# Patient Record
Sex: Female | Born: 1990 | Race: White | Hispanic: No | Marital: Married | Smoking: Never smoker
Health system: Southern US, Community
[De-identification: ages and names within clinical notes are randomized; demographics above are authoritative.]

## PROBLEM LIST (undated history)

## (undated) DIAGNOSIS — D649 Anemia, unspecified: Secondary | ICD-10-CM

## (undated) DIAGNOSIS — R519 Headache, unspecified: Secondary | ICD-10-CM

## (undated) DIAGNOSIS — R51 Headache: Secondary | ICD-10-CM

## (undated) HISTORY — PX: MOUTH SURGERY: SHX715

## (undated) HISTORY — PX: TOOTH EXTRACTION: SUR596

---

## 2015-10-31 NOTE — L&D Delivery Note (Addendum)
Delivery Note G1 at 7717w5d admit in early labor, SROM clear AF at 1445 on 06/19/16, GBS pos w/ PCN prophylaxis initiated on admit. Hydrotherapy x 1 hr then Nitrous Oxide pending epidural Pitocin augmentation for latency Maternal temp and tachy, fetal tachy, presumptive chorio, ABX per protocol  Complete dilation at 0255 Onset of pushing at 0300 FHR second stage category 2  Delivery of a viable baby girl with strong cry at 0344 by CNM in LOA position. Moderate meconium stained fluid following body, minimal amount of fluid noted, not malodorous. Nuchal Cord - none. Cord double clamped after cessation of pulsation, cut by FOB.  Cord blood sample collected.  Arterial cord blood sample - NA.  Placenta delivered schultz intact with 3 VC.  Placenta to pathology. Uterine tone firm, bleeding minimal after initial gush.  2nd degree perineal laceration identified.  Anesthesia: epidural Repair 3.0 vicryl in standard fashion Est. Blood Loss (mL): 300  APGAR: 8 / 9 Mom to postpartum.  Baby to Couplet care / Skin to Skin.  Continue Unasyn 3 gm IVPB q 6 hrs until afebrile x 24 hrs   Neta Mendsaniela C Emery Binz, CNM, MSN 06/20/2016, 4:22 AM

## 2015-11-17 LAB — OB RESULTS CONSOLE GC/CHLAMYDIA
Chlamydia: NEGATIVE
Gonorrhea: NEGATIVE

## 2015-11-17 LAB — OB RESULTS CONSOLE ANTIBODY SCREEN: Antibody Screen: NEGATIVE

## 2015-11-17 LAB — OB RESULTS CONSOLE RPR: RPR: NONREACTIVE

## 2015-11-17 LAB — OB RESULTS CONSOLE ABO/RH: RH TYPE: POSITIVE

## 2015-11-17 LAB — OB RESULTS CONSOLE HEPATITIS B SURFACE ANTIGEN: Hepatitis B Surface Ag: NEGATIVE

## 2015-11-17 LAB — OB RESULTS CONSOLE HIV ANTIBODY (ROUTINE TESTING): HIV: NONREACTIVE

## 2015-11-17 LAB — OB RESULTS CONSOLE RUBELLA ANTIBODY, IGM: Rubella: IMMUNE

## 2015-11-17 LAB — OB RESULTS CONSOLE GBS: STREP GROUP B AG: POSITIVE

## 2016-03-14 ENCOUNTER — Inpatient Hospital Stay (HOSPITAL_COMMUNITY): Admission: AD | Admit: 2016-03-14 | Payer: Self-pay | Source: Ambulatory Visit | Admitting: Obstetrics & Gynecology

## 2016-06-09 ENCOUNTER — Inpatient Hospital Stay (HOSPITAL_COMMUNITY)
Admission: AD | Admit: 2016-06-09 | Discharge: 2016-06-09 | Disposition: A | Discharge status: Home / Self Care | Payer: BLUE CROSS/BLUE SHIELD | Source: Ambulatory Visit | Attending: Obstetrics & Gynecology | Admitting: Obstetrics & Gynecology

## 2016-06-09 ENCOUNTER — Encounter (HOSPITAL_COMMUNITY): Payer: Self-pay | Admitting: *Deleted

## 2016-06-09 DIAGNOSIS — Z3A40 40 weeks gestation of pregnancy: Secondary | ICD-10-CM | POA: Diagnosis not present

## 2016-06-09 HISTORY — DX: Headache, unspecified: R51.9

## 2016-06-09 HISTORY — DX: Anemia, unspecified: D64.9

## 2016-06-09 HISTORY — DX: Headache: R51

## 2016-06-09 LAB — URINALYSIS, ROUTINE W REFLEX MICROSCOPIC
Bilirubin Urine: NEGATIVE
GLUCOSE, UA: NEGATIVE mg/dL
Hgb urine dipstick: NEGATIVE
Ketones, ur: NEGATIVE mg/dL
Nitrite: NEGATIVE
PH: 7 (ref 5.0–8.0)
Protein, ur: NEGATIVE mg/dL
SPECIFIC GRAVITY, URINE: 1.01 (ref 1.005–1.030)

## 2016-06-09 LAB — URINE MICROSCOPIC-ADD ON

## 2016-06-09 NOTE — MAU Note (Signed)
C/o ucs all day; ucs got stronger around 1500 this afternoon;

## 2016-06-09 NOTE — Discharge Instructions (Signed)

## 2016-06-19 ENCOUNTER — Inpatient Hospital Stay (HOSPITAL_COMMUNITY)
Admission: AD | Admit: 2016-06-19 | Discharge: 2016-06-22 | DRG: 775 | Disposition: A | Discharge status: Home / Self Care | Payer: BLUE CROSS/BLUE SHIELD | Source: Ambulatory Visit | Attending: Obstetrics | Admitting: Obstetrics

## 2016-06-19 ENCOUNTER — Inpatient Hospital Stay (HOSPITAL_COMMUNITY): Payer: BLUE CROSS/BLUE SHIELD | Admitting: Anesthesiology

## 2016-06-19 ENCOUNTER — Encounter (HOSPITAL_COMMUNITY): Payer: Self-pay | Admitting: *Deleted

## 2016-06-19 DIAGNOSIS — Z3A40 40 weeks gestation of pregnancy: Secondary | ICD-10-CM

## 2016-06-19 DIAGNOSIS — O411230 Chorioamnionitis, third trimester, not applicable or unspecified: Secondary | ICD-10-CM | POA: Diagnosis present

## 2016-06-19 DIAGNOSIS — O411290 Chorioamnionitis, unspecified trimester, not applicable or unspecified: Secondary | ICD-10-CM | POA: Clinically undetermined

## 2016-06-19 DIAGNOSIS — O99824 Streptococcus B carrier state complicating childbirth: Secondary | ICD-10-CM | POA: Diagnosis present

## 2016-06-19 DIAGNOSIS — O4202 Full-term premature rupture of membranes, onset of labor within 24 hours of rupture: Principal | ICD-10-CM | POA: Diagnosis present

## 2016-06-19 LAB — TYPE AND SCREEN
ABO/RH(D): O POS
Antibody Screen: NEGATIVE

## 2016-06-19 LAB — ABO/RH: ABO/RH(D): O POS

## 2016-06-19 LAB — CBC
HCT: 39.4 % (ref 36.0–46.0)
Hemoglobin: 14.2 g/dL (ref 12.0–15.0)
MCH: 31.2 pg (ref 26.0–34.0)
MCHC: 36 g/dL (ref 30.0–36.0)
MCV: 86.6 fL (ref 78.0–100.0)
PLATELETS: 140 10*3/uL — AB (ref 150–400)
RBC: 4.55 MIL/uL (ref 3.87–5.11)
RDW: 12.7 % (ref 11.5–15.5)
WBC: 14.3 10*3/uL — ABNORMAL HIGH (ref 4.0–10.5)

## 2016-06-19 MED ORDER — EPHEDRINE 5 MG/ML INJ
10.0000 mg | INTRAVENOUS | Status: DC | PRN
  Filled 2016-06-19: qty 4

## 2016-06-19 MED ORDER — LACTATED RINGERS IV SOLN: 500.0000 mL | Freq: Once | INTRAVENOUS | Status: DC

## 2016-06-19 MED ORDER — FENTANYL 2.5 MCG/ML BUPIVACAINE 1/10 % EPIDURAL INFUSION (WH - ANES)
INTRAMUSCULAR | Status: AC
  Filled 2016-06-19: qty 125

## 2016-06-19 MED ORDER — TERBUTALINE SULFATE 1 MG/ML IJ SOLN
0.2500 mg | Freq: Once | INTRAMUSCULAR | Status: DC | PRN
  Filled 2016-06-19: qty 1

## 2016-06-19 MED ORDER — ONDANSETRON HCL 4 MG/2ML IJ SOLN: 4.0000 mg | Freq: Four times a day (QID) | INTRAMUSCULAR | Status: DC | PRN

## 2016-06-19 MED ORDER — OXYCODONE-ACETAMINOPHEN 5-325 MG PO TABS: 1.0000 | ORAL_TABLET | ORAL | Status: DC | PRN

## 2016-06-19 MED ORDER — OXYCODONE-ACETAMINOPHEN 5-325 MG PO TABS: 2.0000 | ORAL_TABLET | ORAL | Status: DC | PRN

## 2016-06-19 MED ORDER — FLEET ENEMA 7-19 GM/118ML RE ENEM: 1.0000 | ENEMA | Freq: Every day | RECTAL | Status: DC | PRN

## 2016-06-19 MED ORDER — FENTANYL 2.5 MCG/ML BUPIVACAINE 1/10 % EPIDURAL INFUSION (WH - ANES)
14.0000 mL/h | INTRAMUSCULAR | Status: DC | PRN
  Administered 2016-06-19: 14 mL/h via EPIDURAL

## 2016-06-19 MED ORDER — OXYTOCIN 40 UNITS IN LACTATED RINGERS INFUSION - SIMPLE MED
2.5000 [IU]/h | INTRAVENOUS | Status: DC
  Filled 2016-06-19: qty 1000

## 2016-06-19 MED ORDER — SOD CITRATE-CITRIC ACID 500-334 MG/5ML PO SOLN
30.0000 mL | ORAL | Status: DC | PRN
  Administered 2016-06-19: 30 mL via ORAL
  Filled 2016-06-19: qty 15

## 2016-06-19 MED ORDER — LIDOCAINE HCL (PF) 1 % IJ SOLN
30.0000 mL | INTRAMUSCULAR | Status: DC | PRN
  Filled 2016-06-19: qty 30

## 2016-06-19 MED ORDER — PENICILLIN G POTASSIUM 5000000 UNITS IJ SOLR
5.0000 10*6.[IU] | Freq: Once | INTRAVENOUS | Status: AC
  Administered 2016-06-19: 5 10*6.[IU] via INTRAVENOUS
  Filled 2016-06-19: qty 5

## 2016-06-19 MED ORDER — DIPHENHYDRAMINE HCL 50 MG/ML IJ SOLN
12.5000 mg | INTRAMUSCULAR | Status: DC | PRN
  Administered 2016-06-20: 12.5 mg via INTRAVENOUS
  Filled 2016-06-19: qty 1

## 2016-06-19 MED ORDER — PENICILLIN G POTASSIUM 5000000 UNITS IJ SOLR
2.5000 10*6.[IU] | INTRAMUSCULAR | Status: DC
  Administered 2016-06-19 – 2016-06-20 (×2): 2.5 10*6.[IU] via INTRAVENOUS
  Filled 2016-06-19 (×5): qty 2.5

## 2016-06-19 MED ORDER — PHENYLEPHRINE 40 MCG/ML (10ML) SYRINGE FOR IV PUSH (FOR BLOOD PRESSURE SUPPORT)
PREFILLED_SYRINGE | INTRAVENOUS | Status: AC
  Filled 2016-06-19: qty 20

## 2016-06-19 MED ORDER — OXYTOCIN BOLUS FROM INFUSION
500.0000 mL | Freq: Once | INTRAVENOUS | Status: AC
  Administered 2016-06-20: 500 mL via INTRAVENOUS

## 2016-06-19 MED ORDER — PHENYLEPHRINE 40 MCG/ML (10ML) SYRINGE FOR IV PUSH (FOR BLOOD PRESSURE SUPPORT)
80.0000 ug | PREFILLED_SYRINGE | INTRAVENOUS | Status: DC | PRN
  Filled 2016-06-19: qty 5

## 2016-06-19 MED ORDER — OXYTOCIN 40 UNITS IN LACTATED RINGERS INFUSION - SIMPLE MED
1.0000 m[IU]/min | INTRAVENOUS | Status: DC
  Administered 2016-06-19: 2 m[IU]/min via INTRAVENOUS

## 2016-06-19 MED ORDER — LIDOCAINE HCL (PF) 1 % IJ SOLN
INTRAMUSCULAR | Status: DC | PRN
  Administered 2016-06-19 (×2): 6 mL

## 2016-06-19 MED ORDER — ACETAMINOPHEN 325 MG PO TABS
650.0000 mg | ORAL_TABLET | ORAL | Status: DC | PRN
  Administered 2016-06-20: 650 mg via ORAL
  Filled 2016-06-19: qty 2

## 2016-06-19 MED ORDER — LACTATED RINGERS IV SOLN
INTRAVENOUS | Status: DC
  Administered 2016-06-19: 16:00:00 via INTRAVENOUS

## 2016-06-19 MED ORDER — LACTATED RINGERS IV SOLN
500.0000 mL | INTRAVENOUS | Status: DC | PRN
  Administered 2016-06-19 – 2016-06-20 (×2): 500 mL via INTRAVENOUS

## 2016-06-19 NOTE — Anesthesia Preprocedure Evaluation (Signed)
Anesthesia Evaluation  Patient identified by MRN, date of birth, ID band Patient awake    Reviewed: Allergy & Precautions, NPO status , Patient's Chart, lab work & pertinent test results  Airway Mallampati: II  TM Distance: >3 FB Neck ROM: Full    Dental no notable dental hx.    Pulmonary neg pulmonary ROS,    Pulmonary exam normal breath sounds clear to auscultation       Cardiovascular negative cardio ROS Normal cardiovascular exam Rhythm:Regular Rate:Normal     Neuro/Psych negative neurological ROS  negative psych ROS   GI/Hepatic negative GI ROS, Neg liver ROS,   Endo/Other  negative endocrine ROS  Renal/GU negative Renal ROS  negative genitourinary   Musculoskeletal negative musculoskeletal ROS (+)   Abdominal   Peds negative pediatric ROS (+)  Hematology negative hematology ROS (+)   Anesthesia Other Findings   Reproductive/Obstetrics negative OB ROS                             Anesthesia Physical Anesthesia Plan  ASA: II  Anesthesia Plan: Epidural   Post-op Pain Management:    Induction: Intravenous  Airway Management Planned: Natural Airway  Additional Equipment:   Intra-op Plan:   Post-operative Plan:   Informed Consent: I have reviewed the patients History and Physical, chart, labs and discussed the procedure including the risks, benefits and alternatives for the proposed anesthesia with the patient or authorized representative who has indicated his/her understanding and acceptance.   Dental advisory given  Plan Discussed with: CRNA  Anesthesia Plan Comments: (Informed consent obtained prior to proceeding including risk of failure, 1% risk of PDPH, risk of minor discomfort and bruising.  Discussed rare but serious complications including epidural abscess, permanent nerve injury, epidural hematoma.  Discussed alternatives to epidural analgesia and patient desires  to proceed.  Timeout performed pre-procedure verifying patient name, procedure, and platelet count.  Patient tolerated procedure well. Platelets 140K at 1600.)        Anesthesia Quick Evaluation

## 2016-06-19 NOTE — Progress Notes (Signed)
Patient ID: Destiny Lambert, female   DOB: 01-28-1991, 25 y.o.   MRN: [id number] S: Doing well, pain controlled,  Epidural effective.  No pelvic pressure felt   O: Vitals:   06/19/16 2050 06/19/16 2055 06/19/16 2100 06/19/16 2200  BP: 101/66 97/80 110/79 125/72  Pulse: (!) 105 (!) 110 (!) 112 (!) 122  Resp:      Temp:      TempSrc:      SpO2: 100% 100%    Weight:      Height:         FHT:  FHR: 150 bpm, variability: to moderate,  accelerations:  Abscent,  decelerations:  Absent UC:   irregular, every 2-5 minutes SVE:   Dilation: 6 Effacement (%): 80, some edema on R side Station: -2 acynclitic Exam by:: Nicolena Schurman,cnm   A / P: SROM x 7 hrs, afebrile, physiologic labor Irregular ctx, acynclitism  - pitocin augmentation, titrate to regular ctx Maternal tachy - IV fluids bolus Fetal Wellbeing:  Category I Pain Control:  Epidural GBS positive - PCN prophylaxis Anticipated MOD:  NSVD  Neta Mendsaniela C Audria Takeshita, CNM, MSN 06/19/2016, 10:12 PM

## 2016-06-19 NOTE — Progress Notes (Addendum)
Destiny Lambert is a 25 y.o. G1P0 at 2558w5d by ultrasound admitted for active labor, rupture of membranes  Subjective:  Labored in water tub x 1 hour, not able to relax w/ ctx, is tired and wants to rest.   Objective: Vitals:   06/19/16 1618 06/19/16 1633 06/19/16 1715 06/19/16 1810  BP:  (!) 145/93 124/72   Pulse:  (!) 138 94   Resp:  18 18 18   Temp:    97.3 F (36.3 C)  TempSrc:    Oral  Weight: 72.6 kg (160 lb)     Height: 5\' 6"  (1.676 m)       No intake/output data recorded. No intake/output data recorded.   FHT:  FHR: 130 bpm, variability: moderate,  accelerations:  Present,  decelerations:  Absent UC:   regular, every 2-3 minutes SVE:   deferred  Labs:   Recent Labs  06/19/16 1600  WBC 14.3*  HGB 14.2  HCT 39.4  PLT 140*    Assessment / Plan: G1 at 4958w5d, SROM x 4 hrs  Labor: Pitocin augmentation after epidural Preeclampsia:  one elevated BP during ctx, normal otherwise, and no neural s/s Fetal Wellbeing:  Category I Pain Control:  Epidural requested using Nitrous Oxide in interim I/D:  GBS prophylaxis, PCN 1st dose in. Anticipated MOD:  NSVD  Neta Mendsaniela C Paul, CNM, MSN 06/19/2016, 7:31 PM

## 2016-06-19 NOTE — H&P (Signed)
OB ADMISSION/ HISTORY & PHYSICAL:  Admission Date: 06/19/2016  3:26 PM  Admit Diagnosis: Normal labor at term  Destiny Lambert is a 25 y.o. female presenting for SROM at 1445 and labor ctx. Started regular ctx overnight, more intense since SROM. Reports clear amniotic fluid. Plans water birth, has completed class and has doula present. No HA/NV/RUQ pain. Coping with ctx pain. + FM, no VB.  Prenatal History: G1P0   EDC : 06/14/2016, by Other Basis  [facility] since [redacted] weeks gestation Anatomy US normal female anatomy, poserior placenta EFW 7.5 lbs  Prenatal course complicated by Hx migraines, triptan tx in 1st and 2nd trim. GBS positive  Prenatal Labs: ABO, Rh: O (01/18 0000)  Antibody: NEG (08/21 1600) Rubella: Immune (01/18 0000)  RPR: Nonreactive (01/18 0000)  HBsAg: Negative (01/18 0000)  HIV: Non-reactive (01/18 0000)  GBS: Positive (01/18 0000)  3 hr Glucola : wnl   Medical / Surgical History :  Past medical history:  Past Medical History:  Diagnosis Date  . Anemia   . Headache      Past surgical history:  Past Surgical History:  Procedure Laterality Date  . MOUTH SURGERY    . TOOTH EXTRACTION       Family History:  Family History  Problem Relation Age of Onset  . Alcohol abuse Neg Hx   . Arthritis Neg Hx   . Asthma Neg Hx   . Birth defects Neg Hx   . Cancer Neg Hx   . COPD Neg Hx   . Depression Neg Hx   . Diabetes Neg Hx   . Drug abuse Neg Hx   . Early death Neg Hx   . Hearing loss Neg Hx   . Heart disease Neg Hx   . Hyperlipidemia Neg Hx   . Hypertension Neg Hx   . Kidney disease Neg Hx   . Learning disabilities Neg Hx   . Mental illness Neg Hx   . Mental retardation Neg Hx   . Miscarriages / Stillbirths Neg Hx   . Stroke Neg Hx   . Vision loss Neg Hx   . Varicose Veins Neg Hx      Social History:  reports that she has never smoked. She has never used smokeless tobacco. She reports that she does not drink  alcohol or use drugs.   Allergies: Review of patient's allergies indicates no known allergies.    Current Medications at time of admission:  Prescriptions Prior to Admission  Medication Sig Dispense Refill Last Dose  . calcium carbonate (TUMS - DOSED IN MG ELEMENTAL CALCIUM) 500 MG chewable tablet Chew 1-2 tablets by mouth as needed for indigestion or heartburn.   06/18/2016 at Unknown time  . doxylamine, Sleep, (UNISOM) 25 MG tablet Take 25 mg by mouth at bedtime.   06/18/2016 at Unknown time  . Prenatal Vit-Fe Fumarate-FA (PRENATAL MULTIVITAMIN) TABS tablet Take 1 tablet by mouth daily at 12 noon.   06/18/2016 at Unknown time  . ranitidine (ZANTAC) 150 MG tablet Take 150 mg by mouth 2 (two) times daily.   06/18/2016 at Unknown time      Review of Systems: ROS As noted above  Physical Exam:   VSSAF   General: AAO x 3, NAD Heart:RRR Lungs:CTAB Abdomen: gravid, NT, ctx palp mild/mod Extremities: no edema, symetric Genitalia / VE: 3/80/-1 per RN, forebag felt, clear AF observed w/ exam FHR: 130, mod var, + accels, no decels TOCO:q 2-3 min  Labs:  Recent Labs  06/19/16 1600  WBC 14.3*  HGB 14.2  HCT 39.4  PLT 140*     Assessment:  25 y.o. G1P0 at 1437w5d  1. Labor: early with SROM at 1445 today 2. Fetal Wellbeing: Category 1  3. Pain Control: desires hydrotherapy and possibly waterbirth 4. GBS: positive   Plan:  1. Admit to BS 2. Routine L&D orders 3. Hydrotherapy when tub in place,  Analgesia/anesthesia PRN, continuous labor support 4. PCN prophylaxis per protocol 5. Anticipate NSVB     Consultant: Dr. Ernestina PennaFogleman updated w/ POC and agrees.     Neta Mendsaniela C Paul, CNM, MSN 06/19/2016, 5:33 PM

## 2016-06-19 NOTE — MAU Note (Signed)
Pt grossly ruptured, pants are wet.  To go to room 175 per Candis SchatzH. Koran RN

## 2016-06-19 NOTE — Anesthesia Procedure Notes (Signed)
Epidural Patient location during procedure: OB  Staffing Anesthesiologist: Lyrah Bradt  Preanesthetic Checklist Completed: patient identified, site marked, surgical consent, pre-op evaluation, timeout performed, IV checked, risks and benefits discussed and monitors and equipment checked  Epidural Patient position: sitting Prep: DuraPrep Patient monitoring: blood pressure and heart rate Approach: midline Location: L4-L5 Injection technique: LOR saline  Needle:  Needle type: Tuohy  Needle gauge: 17 G Needle length: 9 cm Needle insertion depth: 4 cm Catheter type: closed end flexible Catheter size: 19 Gauge Catheter at skin depth: 12 cm Test dose: negative and Other  Assessment Events: blood not aspirated, injection not painful, no injection resistance, negative IV test and no paresthesia  Additional Notes Reason for block:procedure for pain     

## 2016-06-20 ENCOUNTER — Encounter (HOSPITAL_COMMUNITY): Payer: Self-pay

## 2016-06-20 DIAGNOSIS — O411290 Chorioamnionitis, unspecified trimester, not applicable or unspecified: Secondary | ICD-10-CM | POA: Clinically undetermined

## 2016-06-20 LAB — RPR: RPR: NONREACTIVE

## 2016-06-20 MED ORDER — ACETAMINOPHEN 500 MG PO TABS
1000.0000 mg | ORAL_TABLET | Freq: Four times a day (QID) | ORAL | Status: DC | PRN
  Administered 2016-06-20: 1000 mg via ORAL
  Filled 2016-06-20: qty 2

## 2016-06-20 MED ORDER — IBUPROFEN 600 MG PO TABS
600.0000 mg | ORAL_TABLET | Freq: Four times a day (QID) | ORAL | Status: DC
  Administered 2016-06-20 – 2016-06-22 (×9): 600 mg via ORAL
  Filled 2016-06-20 (×9): qty 1

## 2016-06-20 MED ORDER — ONDANSETRON HCL 4 MG PO TABS: 4.0000 mg | ORAL_TABLET | ORAL | Status: DC | PRN

## 2016-06-20 MED ORDER — FAMOTIDINE 20 MG PO TABS
20.0000 mg | ORAL_TABLET | Freq: Every day | ORAL | Status: DC
  Administered 2016-06-20 – 2016-06-21 (×2): 20 mg via ORAL
  Filled 2016-06-20 (×2): qty 1

## 2016-06-20 MED ORDER — TETANUS-DIPHTH-ACELL PERTUSSIS 5-2.5-18.5 LF-MCG/0.5 IM SUSP: 0.5000 mL | Freq: Once | INTRAMUSCULAR | Status: DC

## 2016-06-20 MED ORDER — DIPHENHYDRAMINE HCL 25 MG PO CAPS: 25.0000 mg | ORAL_CAPSULE | Freq: Four times a day (QID) | ORAL | Status: DC | PRN

## 2016-06-20 MED ORDER — SENNOSIDES-DOCUSATE SODIUM 8.6-50 MG PO TABS
2.0000 | ORAL_TABLET | ORAL | Status: DC
  Administered 2016-06-21 (×2): 2 via ORAL
  Filled 2016-06-20 (×2): qty 2

## 2016-06-20 MED ORDER — PRENATAL MULTIVITAMIN CH
1.0000 | ORAL_TABLET | Freq: Every day | ORAL | Status: DC
  Administered 2016-06-20 – 2016-06-21 (×2): 1 via ORAL
  Filled 2016-06-20 (×2): qty 1

## 2016-06-20 MED ORDER — DIBUCAINE 1 % RE OINT
1.0000 "application " | TOPICAL_OINTMENT | RECTAL | Status: DC | PRN
  Administered 2016-06-21: 1 via RECTAL
  Filled 2016-06-20: qty 28

## 2016-06-20 MED ORDER — ONDANSETRON HCL 4 MG/2ML IJ SOLN: 4.0000 mg | INTRAMUSCULAR | Status: DC | PRN

## 2016-06-20 MED ORDER — WITCH HAZEL-GLYCERIN EX PADS
1.0000 "application " | MEDICATED_PAD | CUTANEOUS | Status: DC | PRN
  Administered 2016-06-20: 1 via TOPICAL

## 2016-06-20 MED ORDER — SODIUM CHLORIDE 0.9 % IV SOLN
3.0000 g | Freq: Four times a day (QID) | INTRAVENOUS | Status: DC
  Administered 2016-06-20 (×2): 3 g via INTRAVENOUS
  Filled 2016-06-20 (×3): qty 3

## 2016-06-20 MED ORDER — SIMETHICONE 80 MG PO CHEW: 80.0000 mg | CHEWABLE_TABLET | ORAL | Status: DC | PRN

## 2016-06-20 MED ORDER — BENZOCAINE-MENTHOL 20-0.5 % EX AERO
1.0000 "application " | INHALATION_SPRAY | CUTANEOUS | Status: DC | PRN
  Filled 2016-06-20: qty 56

## 2016-06-20 MED ORDER — SODIUM CHLORIDE 0.9 % IV SOLN
3.0000 g | Freq: Four times a day (QID) | INTRAVENOUS | Status: DC
  Administered 2016-06-20 – 2016-06-21 (×3): 3 g via INTRAVENOUS
  Filled 2016-06-20 (×7): qty 3

## 2016-06-20 MED ORDER — COCONUT OIL OIL: 1.0000 "application " | TOPICAL_OIL | Status: DC | PRN

## 2016-06-20 NOTE — Lactation Note (Addendum)
This note was copied from a baby's chart. Lactation Consultation Note  Patient Name: Destiny Lambert OZHYQ'MToday's Date: 06/20/2016 Reason for consult: Initial assessment Baby 12 hours old. Mom reports that baby has been nursing at breast for an hour, and then parents spoon-fed baby about 2 ml of EBM. Mom reports that her nipples are sore. Mom has been using a #24 NS that she brought with her to the hospital. Attempted to latch baby to breast, but baby very fussy at breast. Assisted with hand expressing an additional 3 ml of EBM and spoon and finger feeding baby. Baby tolerated well, and able to create a good seal and suction while suckling this LC's gloved finger. Refitted mom with a #16 NS on mom's left breast. Assisted with repositioning mom, using pillows, and latched baby to left breast using #16 NS. Mom reported increased comfort. Baby able to nurse with a few swallows noted, and some colostrum in NS when baby came off breast. Discussed with mom that she may need a #20 NS on right breast as nipple is larger.   Discussed with parents that NS a temporary device, and enc attempting to nurse without the shield as she is able. Mom's nipples do evert with stimulation, but baby so fussy and not willing to latch without NS right now. Mom has manual pump in room for additional stimulation of breast. Mom's colostrum is easily expressible and flows well. Enc mom to hand express prior to next feeding, and demonstrated to parents how to place EBM in NS using curve-tipped syringe. Discussed how this may enc baby to continue nursing with shield and transfer more EBM from breast.   Discussed assessment and interventions with patient's bedside nurse, Johnny BridgeMartha, RN.   Maternal Data    Feeding Feeding Type: Breast Fed Length of feed: 15 min  LATCH Score/Interventions Latch: Repeated attempts needed to sustain latch, nipple held in mouth throughout feeding, stimulation needed to elicit sucking  reflex. Intervention(s): Adjust position;Assist with latch;Breast compression  Audible Swallowing: A few with stimulation Intervention(s): Skin to skin;Hand expression  Type of Nipple: Flat (short shaft) Intervention(s): Hand pump  Comfort (Breast/Nipple): Filling, red/small blisters or bruises, mild/mod discomfort  Problem noted: Mild/Moderate discomfort Interventions (Mild/moderate discomfort): Hand expression  Hold (Positioning): Assistance needed to correctly position infant at breast and maintain latch. Intervention(s): Breastfeeding basics reviewed;Support Pillows;Position options;Skin to skin  LATCH Score: 5  Lactation Tools Discussed/Used Tools: Nipple Dorris CarnesShields;Pump Nipple shield size: 16 Breast pump type: Manual   Consult Status Consult Status: Follow-up Date: 06/21/16 Follow-up type: In-patient    Sherlyn HayJennifer D Taitum Menton 06/20/2016, 4:15 PM

## 2016-06-20 NOTE — Anesthesia Postprocedure Evaluation (Signed)
Anesthesia Post Note  Patient: Destiny Lambert  Procedure(s) Performed: * No procedures listed *  Patient location during evaluation: Mother Baby Anesthesia Type: Epidural Level of consciousness: awake and alert Pain management: pain level controlled Vital Signs Assessment: post-procedure vital signs reviewed and stable Respiratory status: spontaneous breathing Cardiovascular status: stable Postop Assessment: patient able to bend at knees, epidural receding, no backache and no headache Anesthetic complications: no     Last Vitals:  Vitals:   06/20/16 0858 06/20/16 1313  BP: 106/66 (!) 106/49  Pulse: (!) 104 98  Resp: 20 17  Temp: 37 C 36.8 C    Last Pain:  Vitals:   06/20/16 1313  TempSrc: Oral  PainSc:    Pain Goal: Patients Stated Pain Goal: 0 (06/20/16 0858)               Edison PaceWILKERSON,Stran Raper

## 2016-06-20 NOTE — Progress Notes (Signed)
S: Comfortable w/ epidural, no pressure.  O: Vitals:   06/20/16 0000 06/20/16 0030 06/20/16 0100 06/20/16 0130  BP: 109/71 109/67 127/77 (!) 103/52  Pulse: (!) 102 96 (!) 110 86  Resp:   18 18  Temp:   100 F (37.8 C)   TempSrc:   Oral   SpO2:      Weight:      Height:       FHR 165, minimal variability, no decels, occasional small accels Ctx q 2-3 min, mod to palp GU: 8/80/-2 at 2345, + show, some swelling on R side, acynclitic Clear AF, no malodor  Pitocin @ 8 mu/min Foley cath draining clear yellow urine  A/P: Active labor, Pitocin augmentation, comfortable w/ epidural SROM x 11 hrs FHT category 2  Maternal temp and fetal and maternal tachy Presumed chorio, cannot r/o epidural as causative factor GBS positive w/ adequate prophylaxis to date  Will change ABX to Unasyn 3 gm q 6 hrs for broad spectrum coverage APAP 1 gm q 6 hrs prn for fever Continue cautiously towards vaginal birth  Dr. Ernestina PennaFogleman updated.  Neta MendsDaniela C Amen Staszak, CNM 06/20/2016 2:13 AM

## 2016-06-21 LAB — CBC
HCT: 31.7 % — ABNORMAL LOW (ref 36.0–46.0)
Hemoglobin: 10.8 g/dL — ABNORMAL LOW (ref 12.0–15.0)
MCH: 29.9 pg (ref 26.0–34.0)
MCHC: 34.1 g/dL (ref 30.0–36.0)
MCV: 87.8 fL (ref 78.0–100.0)
Platelets: 118 10*3/uL — ABNORMAL LOW (ref 150–400)
RBC: 3.61 MIL/uL — ABNORMAL LOW (ref 3.87–5.11)
RDW: 13 % (ref 11.5–15.5)
WBC: 16.3 10*3/uL — ABNORMAL HIGH (ref 4.0–10.5)

## 2016-06-21 MED ORDER — OXYCODONE-ACETAMINOPHEN 5-325 MG PO TABS
2.0000 | ORAL_TABLET | ORAL | Status: DC | PRN
  Administered 2016-06-21 (×2): 2 via ORAL
  Filled 2016-06-21 (×2): qty 2

## 2016-06-21 MED ORDER — BACID PO TABS: 2.0000 | ORAL_TABLET | Freq: Three times a day (TID) | ORAL | Status: DC

## 2016-06-21 MED ORDER — BACID PO TABS
2.0000 | ORAL_TABLET | Freq: Three times a day (TID) | ORAL | Status: DC
  Filled 2016-06-21: qty 2

## 2016-06-21 MED ORDER — OXYCODONE-ACETAMINOPHEN 5-325 MG PO TABS
1.0000 | ORAL_TABLET | ORAL | Status: DC | PRN
  Administered 2016-06-21 – 2016-06-22 (×2): 1 via ORAL
  Filled 2016-06-21 (×2): qty 1

## 2016-06-21 MED ORDER — RISAQUAD PO CAPS
1.0000 | ORAL_CAPSULE | Freq: Every day | ORAL | Status: DC
  Administered 2016-06-21: 1 via ORAL
  Filled 2016-06-21 (×2): qty 1

## 2016-06-21 NOTE — Lactation Note (Signed)
This note was copied from a baby's chart. Lactation Consultation Note  Patient Name: Destiny Lambert OZHYQ'MToday's Date: 06/21/2016 Reason for consult: Follow-up assessment;Difficult latch Baby 35 hours old. Parents report that they decided to supplement with formula, but they want to work toward exclusive BF. Set mom up with DEBP and mom able to pump 2 ml. Assisted mom to latch baby to left breast in football position using #16 NS, and prefilled shield with 2 ml of EBM. Baby able to nurse off and on for about 20 minutes and able to transfer most of the EBM from NS. FOB then supplemented baby with formula.   Plan is for mom to put baby to breast with cues, using the NS prefilled with EBM. Enc parents to supplement baby with EBM./formula according to supplementation guidelines, which were given with review. Enc mom to then postpump for 15 minutes followed by hand expression. Mom states that she has a DEBP at home Chiropractor([employer]). Mom given SNS with review. Discussed using SNS to keep baby at breast for entire feeding, but mom not wanting to use at this time. Mom given written copy of feeding plan, and "Infant Formula Preparation, Handling, and Storage" guidelines. Mom aware of OP/BFSG and LC phone line assistance after D/C.   Maternal Data    Feeding Feeding Type: Breast Fed  LATCH Score/Interventions Latch: Repeated attempts needed to sustain latch, nipple held in mouth throughout feeding, stimulation needed to elicit sucking reflex. Intervention(s): Adjust position;Assist with latch;Breast compression  Audible Swallowing: A few with stimulation  Type of Nipple: Flat  Comfort (Breast/Nipple): Soft / non-tender     Hold (Positioning): Assistance needed to correctly position infant at breast and maintain latch. Intervention(s): Breastfeeding basics reviewed;Support Pillows;Position options;Skin to skin  LATCH Score: 6  Lactation Tools Discussed/Used Tools: Nipple Dorris CarnesShields;Pump Nipple shield  size: 16 Breast pump type: Double-Electric Breast Pump Pump Review: Setup, frequency, and cleaning;Milk Storage Initiated by:: JW Date initiated:: 06/21/16   Consult Status Consult Status: Follow-up Date: 06/22/16 Follow-up type: In-patient    Sherlyn HayJennifer D Arad Burston 06/21/2016, 3:18 PM

## 2016-06-21 NOTE — Progress Notes (Signed)
Patient ID: Destiny Lambert, female   DOB: February 24, 1991, 25 y.o.   MRN: [id number] PPD # 1 SVD Information for the patient's newborn:  Daine FlorasCline, Girl Raeanne [[id number]][[id number]]  female    breast and bottle feeding  Baby name: Lenard Gallowaymelia Josephine  S:  Reports feeling tired but well. Has been frustrated getting baby to breast overnight, has not been able to sooth her on breast, no latch yet with NS or without. Has been using formula overnight and baby now more settled.              Tolerating po/ No nausea or vomiting             Bleeding is light             Pain controlled with Motrin and Percocet             Up ad lib / ambulatory / voiding without difficulties        O:  A & O x 3, in no apparent distress              VS:  Vitals:   06/20/16 0858 06/20/16 1313 06/20/16 1959 06/21/16 0700  BP: 106/66 (!) 106/49 104/60 103/77  Pulse: (!) 104 98 92 75  Resp: 20 17 16 18   Temp: 98.6 F (37 C) 98.3 F (36.8 C) 98.1 F (36.7 C) 97.7 F (36.5 C)  TempSrc: Oral Oral Oral   SpO2: 99% 99% 100%   Weight:      Height:        LABS:  Recent Labs  06/19/16 1600 06/21/16 0534  WBC 14.3* 16.3*  HGB 14.2 10.8*  HCT 39.4 31.7*  PLT 140* 118*    Blood type: --/--/O POS, O POS (08/21 1600)  Rubella: Immune (01/18 0000)   I&O: I/O last 3 completed shifts: In: -  Out: 2900 [Urine:2600; Blood:300]          No intake/output data recorded.  Lungs: Clear and unlabored  Heart: regular rate and rhythm / no murmurs  Abdomen: soft, non-tender, non-distended             Fundus: firm, non-tender, U-1  Perineum: repair intact, mild edema  Lochia: small  Extremities: trace edema, no calf pain or tenderness, IV infiltration site receded    A/P: PPD # 1 25 y.o., G1P1001   Principal Problem:   Postpartum care following vaginal delivery (8/22) Active Problems:   Normal labor and delivery   Chorioamnionitis  Afebrile since baby's birth, no s/s endometritis  Breastfeeding - needs support, worked with latching  baby on, able to get baby to latch with NS in footbal hold, recc adding S&S formula or EBM, feed baby to sooth initial hunger so not frustrated trying to latch on.   Routine post partum orders  Anticipate discharge tomorrow    Neta MendsDaniela C Shatera Rennert, MSN, CNM 06/21/2016, 10:12 AM

## 2016-06-21 NOTE — Progress Notes (Signed)
While Unasyn was running, patient arm began to swell, IV infiltrated. Left arm is swollen. Elevated arm. Made Marlinda Mikeanya Bailey, CNM aware. No new orders.

## 2016-06-21 NOTE — Progress Notes (Signed)
Patient in tears stating that her bottom hurts and requested something stronger than ibuprofen. Spoke with Marlinda Miketanya bailey, cnm, see new orders.

## 2016-06-22 MED ORDER — COCONUT OIL OIL: 1.0000 "application " | TOPICAL_OIL | 0 refills | Status: DC | PRN

## 2016-06-22 MED ORDER — BENZOCAINE-MENTHOL 20-0.5 % EX AERO: 1.0000 "application " | INHALATION_SPRAY | CUTANEOUS | Status: DC | PRN

## 2016-06-22 MED ORDER — RISAQUAD PO CAPS: 2.0000 | ORAL_CAPSULE | Freq: Every day | ORAL | 3 refills | Status: DC

## 2016-06-22 MED ORDER — IBUPROFEN 600 MG PO TABS: 600.0000 mg | ORAL_TABLET | Freq: Four times a day (QID) | ORAL | 0 refills | Status: DC

## 2016-06-22 MED ORDER — OXYCODONE-ACETAMINOPHEN 5-325 MG PO TABS: 1.0000 | ORAL_TABLET | ORAL | 0 refills | Status: DC | PRN

## 2016-06-22 NOTE — Lactation Note (Signed)
This note was copied from a baby's chart. Lactation Consultation Note  Patient Name: Destiny Lambert QMVHQ'IToday's DateBecky Lambert: 06/22/2016 Reason for consult: Follow-up assessment;Difficult latch   Follow up with first time mom of 3553 hour old infant. Infant with 2 bf for usp to 20 minutes, 6 bottle feeds of 10-25 cc, 2 voids, 3 stools and 1 episode of emesis in 24 hours preceding this assessment. Infant weight 7 lb 12.5 oz with weight loss of 3% since birth. LATCH Scores 6 by bedside RN's.   Mom reports she is not BF currently and wishes to go home to work on BF. She declined assistance prior to leaving the hospital. Mom has NS to take home in sizes 16, 20 and 24. She is to see [employer] tomorrow. Mom reports she pumped once last night and did get some colostrum on her shirt. Discussed with mom supply and demand and that if infant is not BF she should be pumping every 2-3 hours for 15-20 minutes to protect supply. All EBM should be fed to infant first, followed by formula as needed.   Reviewed all Bf information in Taking Care of Baby and Me Booklet. Reviewed engorgement prevention/treatment, comfort pumping and pre pumping to soften areola prn. Reviewed I/O and enc parents to maintain feeding log and take to Ped appt. Infant with Ped appt on Saturday per parents. Mom has a pump at home.   Reviewed LC Brochure, mom aware of BF Support Groups, LC phone # and OP Services. Enc mom to call with questions/concerns prn. Mom without questions at this time.    Maternal Data Formula Feeding for Exclusion: No Has patient been taught Hand Expression?: Yes Does the patient have breastfeeding experience prior to this delivery?: No  Feeding    LATCH Score/Interventions                      Lactation Tools Discussed/Used     Consult Status Consult Status: Complete Follow-up type: Call as needed    [name] 06/22/2016, 9:31 AM

## 2016-06-22 NOTE — Progress Notes (Signed)
Post Partum Day #2           Information for the patient's newborn:  Daine FlorasCline, Girl Kresha [[id number]][[id number]]  female   Baby name: Dub Amismelia Josephine Feeding: breast  Subjective: No HA, SOB, CP, F/C, breast symptoms. Pain decreased. Normal vaginal bleeding, no clots.      Objective:  VS:  Vitals:   06/20/16 1959 06/21/16 0700 06/21/16 1812 06/22/16 0523  BP: 104/60 103/77 101/74 105/72  Pulse: 92 75 72 79  Resp: 16 18 16 18   Temp: 98.1 F (36.7 C) 97.7 F (36.5 C) 97.5 F (36.4 C) 98.2 F (36.8 C)  TempSrc: Oral  Oral Oral  SpO2: 100%     Weight:      Height:        No intake or output data in the 24 hours ending 06/22/16 0908     Recent Labs  06/19/16 1600 06/21/16 0534  WBC 14.3* 16.3*  HGB 14.2 10.8*  HCT 39.4 31.7*  PLT 140* 118*    Blood type: --/--/O POS, O POS (08/21 1600) Rubella: Immune (01/18 0000)    Physical Exam:  General: alert, cooperative and no distress Uterine Fundus: firm Lochia: appropriate Perineum: repair healing well, edema resolved DVT Evaluation: No evidence of DVT seen on physical exam. No significant calf/ankle edema.    Assessment/Plan: PPD # 2 / 25 y.o., G1P1001 S/P:augmented vaginal birth and GBS/chorio ABX therapy   Principal Problem:   Postpartum care following vaginal delivery (8/22) Active Problems:   Normal labor and delivery   Chorioamnionitis  Afebrile x 48 hrs since dlivery, no evidence of endometritis   normal postpartum exam  Continue current postpartum care  D/C home   LOS: 3 days   Neta Mendsaniela C Ayaka Andes, CNM, MSN 06/22/2016, 9:08 AM

## 2016-06-22 NOTE — Discharge Summary (Signed)
Obstetric Discharge Summary Reason for Admission: onset of labor and rupture of membranes Prenatal Procedures: ultrasound Intrapartum Procedures: spontaneous vaginal delivery, GBS prophylaxis and epidural Postpartum Procedures: antibiotics Complications-Operative and Postpartum: 2nd degree perineal laceration Hemoglobin  Date Value Ref Range Status  06/21/2016 10.8 (L) 12.0 - 15.0 g/dL Final    Comment:    DELTA CHECK NOTED REPEATED TO VERIFY    HCT  Date Value Ref Range Status  06/21/2016 31.7 (L) 36.0 - 46.0 % Final    Physical Exam:  General: alert, cooperative and no distress Lochia: appropriate Uterine Fundus: firm Incision: healing well DVT Evaluation: No evidence of DVT seen on physical exam.  Discharge Diagnoses: Term Pregnancy-delivered and chorioamnionitis  Discharge Information: Date: 06/22/2016 Activity: pelvic rest Diet: routine Medications: PNV and Ibuprofen Condition: stable Instructions: refer to practice specific booklet Discharge to: home Follow-up Information    Destiny Lambert, CNM. Schedule an appointment as soon as possible for a visit in 6 week(s).   Specialty:  Obstetrics and Gynecology Why:  Postpartum visit Contact information: [place] [place] [address] [phone]           Newborn Data: Live born female "Destiny Lambert" Birth Weight: 8 lb 0.9 oz (3654 g) APGAR: 8, 9  Home with mother.  Destiny MendsDaniela C Shir Bergman, CNM 06/22/2016, 9:25 AM

## 2017-12-14 ENCOUNTER — Observation Stay (HOSPITAL_BASED_OUTPATIENT_CLINIC_OR_DEPARTMENT_OTHER)
Admission: EM | Admit: 2017-12-14 | Discharge: 2017-12-15 | Disposition: A | Discharge status: Home / Self Care | Payer: BLUE CROSS/BLUE SHIELD | Attending: Obstetrics and Gynecology | Admitting: Obstetrics and Gynecology

## 2017-12-14 ENCOUNTER — Emergency Department (HOSPITAL_BASED_OUTPATIENT_CLINIC_OR_DEPARTMENT_OTHER): Payer: BLUE CROSS/BLUE SHIELD

## 2017-12-14 ENCOUNTER — Encounter (HOSPITAL_BASED_OUTPATIENT_CLINIC_OR_DEPARTMENT_OTHER): Payer: Self-pay | Admitting: *Deleted

## 2017-12-14 ENCOUNTER — Other Ambulatory Visit: Payer: Self-pay

## 2017-12-14 DIAGNOSIS — K661 Hemoperitoneum: Secondary | ICD-10-CM | POA: Diagnosis not present

## 2017-12-14 DIAGNOSIS — R102 Pelvic and perineal pain: Secondary | ICD-10-CM | POA: Diagnosis present

## 2017-12-14 DIAGNOSIS — N838 Other noninflammatory disorders of ovary, fallopian tube and broad ligament: Secondary | ICD-10-CM | POA: Diagnosis not present

## 2017-12-14 DIAGNOSIS — N83209 Unspecified ovarian cyst, unspecified side: Secondary | ICD-10-CM | POA: Diagnosis present

## 2017-12-14 LAB — URINALYSIS, ROUTINE W REFLEX MICROSCOPIC
BILIRUBIN URINE: NEGATIVE
GLUCOSE, UA: NEGATIVE mg/dL
Hgb urine dipstick: NEGATIVE
Ketones, ur: NEGATIVE mg/dL
Nitrite: NEGATIVE
Protein, ur: 30 mg/dL — AB
SPECIFIC GRAVITY, URINE: 1.02 (ref 1.005–1.030)
pH: 6 (ref 5.0–8.0)

## 2017-12-14 LAB — HCG, QUANTITATIVE, PREGNANCY

## 2017-12-14 LAB — CBC
HCT: 29 % — ABNORMAL LOW (ref 36.0–46.0)
Hemoglobin: 10.2 g/dL — ABNORMAL LOW (ref 12.0–15.0)
MCH: 32 pg (ref 26.0–34.0)
MCHC: 35.2 g/dL (ref 30.0–36.0)
MCV: 90.9 fL (ref 78.0–100.0)
PLATELETS: 163 10*3/uL (ref 150–400)
RBC: 3.19 MIL/uL — ABNORMAL LOW (ref 3.87–5.11)
RDW: 11.8 % (ref 11.5–15.5)
WBC: 10.9 10*3/uL — AB (ref 4.0–10.5)

## 2017-12-14 LAB — COMPREHENSIVE METABOLIC PANEL
ALBUMIN: 4.7 g/dL (ref 3.5–5.0)
ALK PHOS: 51 U/L (ref 38–126)
ALT: 12 U/L — ABNORMAL LOW (ref 14–54)
AST: 16 U/L (ref 15–41)
Anion gap: 8 (ref 5–15)
BILIRUBIN TOTAL: 0.7 mg/dL (ref 0.3–1.2)
BUN: 12 mg/dL (ref 6–20)
CALCIUM: 8.6 mg/dL — AB (ref 8.9–10.3)
CO2: 25 mmol/L (ref 22–32)
Chloride: 101 mmol/L (ref 101–111)
Creatinine, Ser: 0.66 mg/dL (ref 0.44–1.00)
GFR calc Af Amer: >60 mL/min (ref >60)
GLUCOSE: 97 mg/dL (ref 65–99)
Potassium: 3.6 mmol/L (ref 3.5–5.1)
Sodium: 134 mmol/L — ABNORMAL LOW (ref 135–145)
TOTAL PROTEIN: 7 g/dL (ref 6.5–8.1)

## 2017-12-14 LAB — CBC WITH DIFFERENTIAL/PLATELET
BASOS PCT: 0 %
Basophils Absolute: 0 10*3/uL (ref 0.0–0.1)
EOS ABS: 0 10*3/uL (ref 0.0–0.7)
EOS PCT: 0 %
HCT: 33.6 % — ABNORMAL LOW (ref 36.0–46.0)
Hemoglobin: 11.8 g/dL — ABNORMAL LOW (ref 12.0–15.0)
Lymphocytes Relative: 26 %
Lymphs Abs: 2.9 10*3/uL (ref 0.7–4.0)
MCH: 31.6 pg (ref 26.0–34.0)
MCHC: 35.1 g/dL (ref 30.0–36.0)
MCV: 90.1 fL (ref 78.0–100.0)
MONO ABS: 0.7 10*3/uL (ref 0.1–1.0)
MONOS PCT: 7 %
Neutro Abs: 7.6 10*3/uL (ref 1.7–7.7)
Neutrophils Relative %: 67 %
PLATELETS: 186 10*3/uL (ref 150–400)
RBC: 3.73 MIL/uL — ABNORMAL LOW (ref 3.87–5.11)
RDW: 12.1 % (ref 11.5–15.5)
WBC: 11.2 10*3/uL — ABNORMAL HIGH (ref 4.0–10.5)

## 2017-12-14 LAB — LIPASE, BLOOD: Lipase: 25 U/L (ref 11–51)

## 2017-12-14 LAB — URINALYSIS, MICROSCOPIC (REFLEX): RBC / HPF: NONE SEEN RBC/hpf (ref 0–5)

## 2017-12-14 LAB — PREGNANCY, URINE: Preg Test, Ur: NEGATIVE

## 2017-12-14 MED ORDER — SODIUM CHLORIDE 0.9 % IV SOLN: INTRAVENOUS | Status: DC

## 2017-12-14 MED ORDER — IOPAMIDOL (ISOVUE-300) INJECTION 61%
100.0000 mL | Freq: Once | INTRAVENOUS | Status: AC | PRN
  Administered 2017-12-14: 100 mL via INTRAVENOUS

## 2017-12-14 MED ORDER — SODIUM CHLORIDE 0.9 % IV BOLUS (SEPSIS)
1000.0000 mL | Freq: Once | INTRAVENOUS | Status: AC
  Administered 2017-12-14: 1000 mL via INTRAVENOUS

## 2017-12-14 MED ORDER — MORPHINE SULFATE (PF) 4 MG/ML IV SOLN
4.0000 mg | INTRAVENOUS | Status: DC | PRN
  Administered 2017-12-14 – 2017-12-15 (×3): 4 mg via INTRAVENOUS
  Filled 2017-12-14 (×4): qty 1

## 2017-12-14 NOTE — ED Triage Notes (Signed)
Pt reports Abd pain, cramping, started at 9pm. "worse than any period cramps". One episode of diarrhea. States pain is generalized over abdomen and worse when she lies down

## 2017-12-14 NOTE — ED Notes (Signed)
In to draw blood on pt. And she is in radiology.

## 2017-12-14 NOTE — ED Notes (Signed)
Patient transported to CT 

## 2017-12-15 ENCOUNTER — Other Ambulatory Visit: Payer: Self-pay

## 2017-12-15 ENCOUNTER — Encounter (HOSPITAL_COMMUNITY): Payer: Self-pay | Admitting: *Deleted

## 2017-12-15 DIAGNOSIS — N83209 Unspecified ovarian cyst, unspecified side: Secondary | ICD-10-CM | POA: Diagnosis present

## 2017-12-15 LAB — CBC WITH DIFFERENTIAL/PLATELET
BASOS PCT: 0 %
Basophils Absolute: 0 10*3/uL (ref 0.0–0.1)
EOS ABS: 0 10*3/uL (ref 0.0–0.7)
Eosinophils Relative: 0 %
HCT: 25 % — ABNORMAL LOW (ref 36.0–46.0)
HEMOGLOBIN: 9 g/dL — AB (ref 12.0–15.0)
Lymphocytes Relative: 23 %
Lymphs Abs: 2 10*3/uL (ref 0.7–4.0)
MCH: 32.1 pg (ref 26.0–34.0)
MCHC: 36 g/dL (ref 30.0–36.0)
MCV: 89.3 fL (ref 78.0–100.0)
Monocytes Absolute: 0.4 10*3/uL (ref 0.1–1.0)
Monocytes Relative: 4 %
NEUTROS PCT: 73 %
Neutro Abs: 6.2 10*3/uL (ref 1.7–7.7)
Platelets: 135 10*3/uL — ABNORMAL LOW (ref 150–400)
RBC: 2.8 MIL/uL — ABNORMAL LOW (ref 3.87–5.11)
RDW: 12.4 % (ref 11.5–15.5)
WBC: 8.6 10*3/uL (ref 4.0–10.5)

## 2017-12-15 LAB — TYPE AND SCREEN
ABO/RH(D): O POS
ANTIBODY SCREEN: NEGATIVE

## 2017-12-15 LAB — CBC
HCT: 24.6 % — ABNORMAL LOW (ref 36.0–46.0)
Hemoglobin: 8.7 g/dL — ABNORMAL LOW (ref 12.0–15.0)
MCH: 31.6 pg (ref 26.0–34.0)
MCHC: 35.4 g/dL (ref 30.0–36.0)
MCV: 89.5 fL (ref 78.0–100.0)
PLATELETS: 130 10*3/uL — AB (ref 150–400)
RBC: 2.75 MIL/uL — AB (ref 3.87–5.11)
RDW: 12.5 % (ref 11.5–15.5)
WBC: 6.9 10*3/uL (ref 4.0–10.5)

## 2017-12-15 MED ORDER — DEXTROSE IN LACTATED RINGERS 5 % IV SOLN
INTRAVENOUS | Status: DC
  Administered 2017-12-15: 125 mL/h via INTRAVENOUS

## 2017-12-15 MED ORDER — OXYCODONE-ACETAMINOPHEN 5-325 MG PO TABS: 1.0000 | ORAL_TABLET | Freq: Four times a day (QID) | ORAL | 0 refills | Status: DC | PRN

## 2017-12-15 MED ORDER — OXYCODONE-ACETAMINOPHEN 5-325 MG PO TABS
1.0000 | ORAL_TABLET | Freq: Four times a day (QID) | ORAL | Status: DC | PRN
  Administered 2017-12-15: 2 via ORAL
  Filled 2017-12-15: qty 2

## 2017-12-15 NOTE — ED Provider Notes (Signed)
[facility] EMERGENCY DEPARTMENT Provider Note   CSN: [id number] Arrival date & time: 12/14/17  1418     History   Chief Complaint Chief Complaint  Patient presents with  . Abdominal Pain    HPI Destiny Lambert is a 27 y.o. female.  HPI  27 year old female comes in with chief complaint of abdominal pain. Patient does not have significant medical history.  Patient reports that she started having cramping abdominal pain starting 9 PM last night.  Patient's pain is severe and located primarily in the lower quadrants of the abdomen.  Over time however patient's pain has become more generalized, and it is worse when she is laying down.  Patient also has had 2 episodes where she became diaphoretic and dizzy, with near fainting.  Patient denies any associated vaginal discharge, vaginal bleeding, UTI-like symptoms, flank pain.  Patient is not at risk for STDs.  Patient last menstrual period was 3 weeks ago.  Patient does not have any known gynecologic conditions.  Past Medical History:  Diagnosis Date  . Anemia   . Headache     Patient Active Problem List   Diagnosis Date Noted  . Chorioamnionitis 06/20/2016  . Postpartum care following vaginal delivery (8/22) 06/20/2016  . Normal labor and delivery 06/19/2016    Past Surgical History:  Procedure Laterality Date  . MOUTH SURGERY    . TOOTH EXTRACTION      OB History    Gravida Para Term Preterm AB Living   1 1 1     1    SAB TAB Ectopic Multiple Live Births         0 1       Home Medications    Prior to Admission medications   Medication Sig Start Date End Date Taking? Authorizing Provider  ibuprofen (ADVIL,MOTRIN) 600 MG tablet Take 1 tablet (600 mg total) by mouth every 6 (six) hours. 06/22/16  Yes Arlan Organ C, CNM  acidophilus (RISAQUAD) CAPS capsule Take 2 capsules by mouth daily. 06/22/16   Neta Mends, CNM  benzocaine-Menthol (DERMOPLAST) 20-0.5 % AERO Apply 1 application topically as needed for  irritation (perineal discomfort). 06/22/16   Neta Mends, CNM  calcium carbonate (TUMS - DOSED IN MG ELEMENTAL CALCIUM) 500 MG chewable tablet Chew 1-2 tablets by mouth as needed for indigestion or heartburn.    [provider]  coconut oil OIL Apply 1 application topically as needed. 06/22/16   Neta Mends, CNM  oxyCODONE-acetaminophen (PERCOCET/ROXICET) 5-325 MG tablet Take 1 tablet by mouth every 4 (four) hours as needed for moderate pain (for pain scale 4-7). 06/22/16   Neta Mends, CNM  Prenatal Vit-Fe Fumarate-FA (PRENATAL MULTIVITAMIN) TABS tablet Take 1 tablet by mouth daily at 12 noon.    [provider]    Family History Family History  Problem Relation Age of Onset  . Alcohol abuse Neg Hx   . Arthritis Neg Hx   . Asthma Neg Hx   . Birth defects Neg Hx   . Cancer Neg Hx   . COPD Neg Hx   . Depression Neg Hx   . Diabetes Neg Hx   . Drug abuse Neg Hx   . Early death Neg Hx   . Hearing loss Neg Hx   . Heart disease Neg Hx   . Hyperlipidemia Neg Hx   . Hypertension Neg Hx   . Kidney disease Neg Hx   . Learning disabilities Neg Hx   . Mental illness Neg  Hx   . Mental retardation Neg Hx   . Miscarriages / Stillbirths Neg Hx   . Stroke Neg Hx   . Vision loss Neg Hx   . Varicose Veins Neg Hx     Social History Social History   Tobacco Use  . Smoking status: Never Smoker  . Smokeless tobacco: Never Used  Substance Use Topics  . Alcohol use: Yes    Comment: social  . Drug use: No     Allergies   Patient has no known allergies.   Review of Systems Review of Systems  Constitutional: Positive for activity change and diaphoresis.  Gastrointestinal: Positive for abdominal pain.  Allergic/Immunologic: Negative for immunocompromised state.  Neurological: Positive for dizziness.  Hematological: Does not bruise/bleed easily.  All other systems reviewed and are negative.    Physical Exam Updated Vital Signs BP 103/62 (BP Location: Left  Arm)   Pulse 88   Temp 98.4 F (36.9 C) (Oral)   Resp 16   Ht 5\' 5"  (1.651 m)   Wt 61.2 kg (135 lb)   LMP 11/22/2017   SpO2 100%   Breastfeeding? No   BMI 22.47 kg/m   Physical Exam  Constitutional: She is oriented to person, place, and time. She appears well-developed.  HENT:  Head: Normocephalic and atraumatic.  Eyes: EOM are normal.  Neck: Normal range of motion. Neck supple.  Cardiovascular: Normal rate.  Pulmonary/Chest: Effort normal.  Abdominal: Bowel sounds are normal. There is generalized tenderness and tenderness in the right lower quadrant, suprapubic area and left lower quadrant. There is guarding. There is no rebound.  Neurological: She is alert and oriented to person, place, and time.  Skin: Skin is warm and dry.  Nursing note and vitals reviewed.    ED Treatments / Results  Labs (all labs ordered are listed, but only abnormal results are displayed) Labs Reviewed  URINALYSIS, ROUTINE W REFLEX MICROSCOPIC - Abnormal; Notable for the following components:      Result Value   APPearance CLOUDY (*)    Protein, ur 30 (*)    Leukocytes, UA LARGE (*)    All other components within normal limits  URINALYSIS, MICROSCOPIC (REFLEX) - Abnormal; Notable for the following components:   Bacteria, UA MANY (*)    Squamous Epithelial / LPF 0-5 (*)    All other components within normal limits  COMPREHENSIVE METABOLIC PANEL - Abnormal; Notable for the following components:   Sodium 134 (*)    Calcium 8.6 (*)    ALT 12 (*)    All other components within normal limits  CBC WITH DIFFERENTIAL/PLATELET - Abnormal; Notable for the following components:   WBC 11.2 (*)    RBC 3.73 (*)    Hemoglobin 11.8 (*)    HCT 33.6 (*)    All other components within normal limits  CBC - Abnormal; Notable for the following components:   WBC 10.9 (*)    RBC 3.19 (*)    Hemoglobin 10.2 (*)    HCT 29.0 (*)    All other components within normal limits  PREGNANCY, URINE  LIPASE, BLOOD    HCG, QUANTITATIVE, PREGNANCY    EKG  EKG Interpretation None       Radiology US Pelvis Transvanginal Non-ob (tv Only)  Result Date: 12/14/2017 CLINICAL DATA:  27 year old female with abdominal and pelvic pain since yesterday. CT Abdomen and Pelvis today reveals a relatively large volume hemoperitoneum concentrated in the pelvis with asymmetric appearance of the right adnexa. Negative qualitative pregnancy  test earlier, but now a quantitative beta HCG has been performed and is negative (<1). EXAM: TRANSABDOMINAL AND TRANSVAGINAL ULTRASOUND OF PELVIS DOPPLER ULTRASOUND OF OVARIES TECHNIQUE: Both transabdominal and transvaginal ultrasound examinations of the pelvis were performed. Transabdominal technique was performed for global imaging of the pelvis including uterus, ovaries, adnexal regions, and pelvic cul-de-sac. It was necessary to proceed with endovaginal exam following the transabdominal exam to visualize the ovaries. Color and duplex Doppler ultrasound was utilized to evaluate blood flow to the ovaries. COMPARISON:  CT Abdomen and Pelvis 2057 hr today. FINDINGS: Uterus Measurements: 9.1 x 3.2 x 5.5 centimeters. No fibroids or other mass visualized. Incidental nabothian cysts (normal variant). Endometrium Thickness: 12 millimeters.  No focal abnormality visualized. Right ovary Difficult to delineate from the complex hemoperitoneum in the pelvis, however, ovarian parenchyma is suspected on ultrasound to be in the same location which was identified by CT corresponding to image 47, with evidence of several small follicles in conjunction with ovarian parenchyma there. The ovarian size cannot be determined, but color and spectral Doppler evaluation does demonstrate both arterial and venous waveforms (image 64). Left ovary Measurements: 2.5 x 1.7 x 2.0 centimeters. Normal. Multiple small follicles. Pulsed Doppler evaluation of both ovaries demonstrates normal low-resistance arterial and venous  waveforms. Other findings Large volume hemoperitoneum. IMPRESSION: 1. On ultrasound right ovarian parenchyma is suspected in the same location as suspected on the earlier CT, surrounded by hemoperitoneum and blood clot. Subsequently, the ovarian size and margins are not delineated, but there are both arterial and venous waveforms detected arguing against ovarian torsion. Considering the negative quantitative beta HCG result, the main diagnostic consideration now is a ruptured hemorrhagic ovarian cyst. However, I do recommend a follow-up pelvic Ultrasound after the blood has resolved (e.g. 6-8 weeks) to re-evaluate the right ovary and exclude an underlying ovarian mass. 2. Large volume hemoperitoneum redemonstrated. 3. Normal uterus and left ovary. Electronically Signed   By: Odessa Fleming M.D.   On: 12/14/2017 22:47   US Pelvis (transabdominal Only)  Result Date: 12/14/2017 CLINICAL DATA:  27 year old female with abdominal and pelvic pain since yesterday. CT Abdomen and Pelvis today reveals a relatively large volume hemoperitoneum concentrated in the pelvis with asymmetric appearance of the right adnexa. Negative qualitative pregnancy test earlier, but now a quantitative beta HCG has been performed and is negative (<1). EXAM: TRANSABDOMINAL AND TRANSVAGINAL ULTRASOUND OF PELVIS DOPPLER ULTRASOUND OF OVARIES TECHNIQUE: Both transabdominal and transvaginal ultrasound examinations of the pelvis were performed. Transabdominal technique was performed for global imaging of the pelvis including uterus, ovaries, adnexal regions, and pelvic cul-de-sac. It was necessary to proceed with endovaginal exam following the transabdominal exam to visualize the ovaries. Color and duplex Doppler ultrasound was utilized to evaluate blood flow to the ovaries. COMPARISON:  CT Abdomen and Pelvis 2057 hr today. FINDINGS: Uterus Measurements: 9.1 x 3.2 x 5.5 centimeters. No fibroids or other mass visualized. Incidental nabothian cysts (normal  variant). Endometrium Thickness: 12 millimeters.  No focal abnormality visualized. Right ovary Difficult to delineate from the complex hemoperitoneum in the pelvis, however, ovarian parenchyma is suspected on ultrasound to be in the same location which was identified by CT corresponding to image 47, with evidence of several small follicles in conjunction with ovarian parenchyma there. The ovarian size cannot be determined, but color and spectral Doppler evaluation does demonstrate both arterial and venous waveforms (image 64). Left ovary Measurements: 2.5 x 1.7 x 2.0 centimeters. Normal. Multiple small follicles. Pulsed Doppler evaluation of both ovaries demonstrates normal  low-resistance arterial and venous waveforms. Other findings Large volume hemoperitoneum. IMPRESSION: 1. On ultrasound right ovarian parenchyma is suspected in the same location as suspected on the earlier CT, surrounded by hemoperitoneum and blood clot. Subsequently, the ovarian size and margins are not delineated, but there are both arterial and venous waveforms detected arguing against ovarian torsion. Considering the negative quantitative beta HCG result, the main diagnostic consideration now is a ruptured hemorrhagic ovarian cyst. However, I do recommend a follow-up pelvic Ultrasound after the blood has resolved (e.g. 6-8 weeks) to re-evaluate the right ovary and exclude an underlying ovarian mass. 2. Large volume hemoperitoneum redemonstrated. 3. Normal uterus and left ovary. Electronically Signed   By: Odessa Fleming M.D.   On: 12/14/2017 22:47   Ct Abdomen Pelvis W Contrast  Result Date: 12/14/2017 CLINICAL DATA:  27 year old female with acute abdominal pain for 24 hrs. Negative pregnancy test. EXAM: CT ABDOMEN AND PELVIS WITH CONTRAST TECHNIQUE: Multidetector CT imaging of the abdomen and pelvis was performed using the standard protocol following bolus administration of intravenous contrast. CONTRAST:  ISOVUE-300 IOPAMIDOL  (ISOVUE-300) INJECTION 61% COMPARISON:  None. FINDINGS: Lower chest: Lung bases are clear. No pericardial or pleural effusion. Abnormal free fluid throughout the abdomen and pelvis, described following the reproductive section below. Hepatobiliary: Normal liver enhancement. Negative gallbladder. No biliary ductal enlargement. Pancreas: Negative. Spleen: Negative. Adrenals/Urinary Tract: Normal adrenal glands. Bilateral renal enhancement is normal. No hydronephrosis or perinephric stranding. The proximal ureters are decompressed. Diminutive and unremarkable urinary bladder. Stomach/Bowel: Decompressed rectosigmoid colon. Mildly redundant sigmoid. Decompressed left colon. Negative transverse colon. Mildly redundant hepatic flexure. Negative right colon. A retrocecal appendix appears nondilated. No dilated small bowel. Decompressed stomach and duodenum. Vascular/Lymphatic: Major arterial structures in the abdomen and pelvis are patent and normal. The portal venous system is patent. No lymphadenopathy is identified in the abdomen or pelvis. Reproductive: Hyperdense fluid (described below) mostly obscures both ovaries, but there is an asymmetrically enlarged and heterogeneous appearance of the right adnexa on series 2, image 57. The uterus appears within normal limits. Both ovarian veins are enhancing and appear to be patent. Other: Large amount of pelvic and moderate amount of abdominal free fluid. Fluid density in the abdomen appears fairly simple, especially around the liver, spleen, and in the small bowel mesentery. As you approach the dependent portion of the pelvis, the fluid density increases and most resembles hemoperitoneum (series 2, image 63). Musculoskeletal: Negative. IMPRESSION: 1. Large volume of hemoperitoneum in the pelvis. Moderate volume of more simple appearing abdominal free fluid. Superimposed asymmetric and heterogeneous appearance of the right adnexa. Top differential considerations include  ruptured hemorrhagic right ovarian cyst, ruptured ovarian torsion, and ruptured ectopic pregnancy. Recommend quantitative beta HCG to exclude the latter. Recommend follow-up pelvis ultrasound to attempt further characterization. 2. Study discussed by telephone with Dr. Derwood Kaplan on 12/14/2017 at 21:18 . Electronically Signed   By: Odessa Fleming M.D.   On: 12/14/2017 21:22   US Pelvic Doppler (torsion R/o Or Mass Arterial Flow)  Result Date: 12/14/2017 CLINICAL DATA:  27 year old female with abdominal and pelvic pain since yesterday. CT Abdomen and Pelvis today reveals a relatively large volume hemoperitoneum concentrated in the pelvis with asymmetric appearance of the right adnexa. Negative qualitative pregnancy test earlier, but now a quantitative beta HCG has been performed and is negative (<1). EXAM: TRANSABDOMINAL AND TRANSVAGINAL ULTRASOUND OF PELVIS DOPPLER ULTRASOUND OF OVARIES TECHNIQUE: Both transabdominal and transvaginal ultrasound examinations of the pelvis were performed. Transabdominal technique was performed for global  imaging of the pelvis including uterus, ovaries, adnexal regions, and pelvic cul-de-sac. It was necessary to proceed with endovaginal exam following the transabdominal exam to visualize the ovaries. Color and duplex Doppler ultrasound was utilized to evaluate blood flow to the ovaries. COMPARISON:  CT Abdomen and Pelvis 2057 hr today. FINDINGS: Uterus Measurements: 9.1 x 3.2 x 5.5 centimeters. No fibroids or other mass visualized. Incidental nabothian cysts (normal variant). Endometrium Thickness: 12 millimeters.  No focal abnormality visualized. Right ovary Difficult to delineate from the complex hemoperitoneum in the pelvis, however, ovarian parenchyma is suspected on ultrasound to be in the same location which was identified by CT corresponding to image 47, with evidence of several small follicles in conjunction with ovarian parenchyma there. The ovarian size cannot be determined,  but color and spectral Doppler evaluation does demonstrate both arterial and venous waveforms (image 64). Left ovary Measurements: 2.5 x 1.7 x 2.0 centimeters. Normal. Multiple small follicles. Pulsed Doppler evaluation of both ovaries demonstrates normal low-resistance arterial and venous waveforms. Other findings Large volume hemoperitoneum. IMPRESSION: 1. On ultrasound right ovarian parenchyma is suspected in the same location as suspected on the earlier CT, surrounded by hemoperitoneum and blood clot. Subsequently, the ovarian size and margins are not delineated, but there are both arterial and venous waveforms detected arguing against ovarian torsion. Considering the negative quantitative beta HCG result, the main diagnostic consideration now is a ruptured hemorrhagic ovarian cyst. However, I do recommend a follow-up pelvic Ultrasound after the blood has resolved (e.g. 6-8 weeks) to re-evaluate the right ovary and exclude an underlying ovarian mass. 2. Large volume hemoperitoneum redemonstrated. 3. Normal uterus and left ovary. Electronically Signed   By: Odessa Fleming M.D.   On: 12/14/2017 22:47    Procedures Procedures (including critical care time)  CRITICAL CARE Performed by: Mollee Neer   Total critical care time: 48 minutes  Critical care time was exclusive of separately billable procedures and treating other patients.  Critical care was necessary to treat or prevent imminent or life-threatening deterioration.  Critical care was time spent personally by me on the following activities: development of treatment plan with patient and/or surrogate as well as nursing, discussions with consultants, evaluation of patient's response to treatment, examination of patient, obtaining history from patient or surrogate, ordering and performing treatments and interventions, ordering and review of laboratory studies, ordering and review of radiographic studies, pulse oximetry and re-evaluation of patient's  condition.   Medications Ordered in ED Medications  sodium chloride 0.9 % bolus 1,000 mL (1,000 mLs Intravenous New Bag/Given 12/14/17 2006)    And  0.9 %  sodium chloride infusion (not administered)  morphine 4 MG/ML injection 4 mg (4 mg Intravenous Given 12/14/17 2329)  iopamidol (ISOVUE-300) 61 % injection 100 mL (100 mLs Intravenous Contrast Given 12/14/17 2038)     Initial Impression / Assessment and Plan / ED Course  I have reviewed the triage vital signs and the nursing notes.  Pertinent labs & imaging results that were available during my care of the patient were reviewed by me and considered in my medical decision making (see chart for details).  Clinical Course as of Dec 16 35  Fri Dec 14, 2017  2200 CT scan shows large hemi-peritoneum.  Urine pregnancy is negative.  Clinical concerns for potentially a torsion, versus large ovarian cyst that ruptured.  Quant beta hCG has been ordered.  Ultrasound has also been ordered.  Results from the ER workup discussed with the patient face to face and all  questions answered to the best of my ability.  CT ABDOMEN PELVIS W CONTRAST [AN]  2336 Ultrasound results reassuring, ruling effectively torsion out.  Patient has negative beta hCG on serum test, therefore ectopic less likely.  Suspicion high for large ovarian cyst that ruptured.  Given that patient is slightly symptomatic, we are leaning towards admitting her. Repeat CBC pending  US PELVIC DOPPLER (TORSION R/O OR MASS ARTERIAL FLOW) [AN]  Sat Dec 15, 2017  0037 Hemoglobin is dropped by more than 1.5 g Hemodynamically stable. Patient sent to Dr. cousins, [facility]. WBC: (!) 10.9 [AN]    Clinical Course User Index [AN] Derwood KaplanNanavati, Canisha Issac, MD      Final Clinical Impressions(s) / ED Diagnoses   Final diagnoses:  Pelvic pain  Hemoperitoneum   Patient comes in with chief complaint of lower quadrant abdominal pain.  The pain started last night, and over  time the pain has become more generalized.  Patient is also having worsening of her pain with laying flat.  On my exam patient has significant tenderness in the suprapubic region.  Rest of the lower quadrant of the abdomen is also uncomfortable.  Differential diagnosis includes perforated viscus, diverticulitis, PID, TOA, ectopic pregnancy, ureteral colic, appendicitis.  Given the significant tenderness, we will start with CT scan of the abdomen.  Patient is in a very stable relationship, denies any vaginal discharge/bleeding -so suspicion for underlying pelvic infection is low..   ED Discharge Orders    None       Derwood KaplanNanavati, Yareth Macdonnell, MD 12/15/17 [phone]

## 2017-12-15 NOTE — Progress Notes (Signed)

## 2017-12-15 NOTE — Discharge Instructions (Signed)
Abstain from intercourse or strenuous exercise

## 2017-12-15 NOTE — ED Notes (Signed)
Called [facility] for ED to [facility] transport [facility]

## 2017-12-15 NOTE — Progress Notes (Signed)
Dr. Cherly Hensenousins notified of Results for Destiny Lambert, Destiny Lambert (MRN [id number]) as of 12/15/2017 12:25 no orders received.  Ref. Range 12/15/2017 10:53  Hemoglobin Latest Ref Range: 12.0 - 15.0 g/dL 8.7 (L)

## 2017-12-15 NOTE — H&P (Signed)
Destiny Lambert is an 27 y.o. female. G1P1 WF sent from urgent care for observation due to large presumed hemoperitoneum noted on CT scan done for lower abdominal pain. Pt was doing well until Friday pm when pain occurred. LMP 1/24. No contraception. UPT/SPT neg. Pt had serial CBC which showed slight drop in hgb.pt also had pelvic sonogram which ruled out torsion and favors ruptured ovarian cyst.  Pertinent Gynecological History: Menses: reg nl flow Bleeding: nl Contraception: none OB History: G1P1   Menstrual History: Menarche age: n/a Patient's last menstrual period was 11/22/2017.    Past Medical History:  Diagnosis Date  . Anemia   . Headache     Past Surgical History:  Procedure Laterality Date  . MOUTH SURGERY    . TOOTH EXTRACTION      Family History  Problem Relation Age of Onset  . Alcohol abuse Neg Hx   . Arthritis Neg Hx   . Asthma Neg Hx   . Birth defects Neg Hx   . Cancer Neg Hx   . COPD Neg Hx   . Depression Neg Hx   . Diabetes Neg Hx   . Drug abuse Neg Hx   . Early death Neg Hx   . Hearing loss Neg Hx   . Heart disease Neg Hx   . Hyperlipidemia Neg Hx   . Hypertension Neg Hx   . Kidney disease Neg Hx   . Learning disabilities Neg Hx   . Mental illness Neg Hx   . Mental retardation Neg Hx   . Miscarriages / Stillbirths Neg Hx   . Stroke Neg Hx   . Vision loss Neg Hx   . Varicose Veins Neg Hx     Social History:  reports that  has never smoked. she has never used smokeless tobacco. She reports that she drinks alcohol. She reports that she does not use drugs.  Allergies: No Known Allergies  Medications Prior to Admission  Medication Sig Dispense Refill Last Dose  . ibuprofen (ADVIL,MOTRIN) 600 MG tablet Take 1 tablet (600 mg total) by mouth every 6 (six) hours. 30 tablet 0   . acidophilus (RISAQUAD) CAPS capsule Take 2 capsules by mouth daily. 60 capsule 3   . benzocaine-Menthol (DERMOPLAST) 20-0.5 % AERO Apply 1 application topically as needed for  irritation (perineal discomfort).     . calcium carbonate (TUMS - DOSED IN MG ELEMENTAL CALCIUM) 500 MG chewable tablet Chew 1-2 tablets by mouth as needed for indigestion or heartburn.   06/18/2016 at Unknown time  . coconut oil OIL Apply 1 application topically as needed.  0   . oxyCODONE-acetaminophen (PERCOCET/ROXICET) 5-325 MG tablet Take 1 tablet by mouth every 4 (four) hours as needed for moderate pain (for pain scale 4-7). 15 tablet 0   . Prenatal Vit-Fe Fumarate-FA (PRENATAL MULTIVITAMIN) TABS tablet Take 1 tablet by mouth daily at 12 noon.   06/18/2016 at Unknown time    Review of Systems  Constitutional: Negative for fever.  Gastrointestinal: Positive for abdominal pain.  Neurological: Negative for dizziness.    Blood pressure (!) 88/53, pulse 84, temperature 98.1 F (36.7 C), temperature source Oral, resp. rate 18, height 5\' 5"  (1.651 m), weight 61.2 kg (135 lb), last menstrual period 11/22/2017, SpO2 98 %, not currently breastfeeding. Physical Exam  Constitutional: She is oriented to person, place, and time. She appears well-developed and well-nourished. No distress.  Eyes: EOM are normal.  Neck: Neck supple.  Cardiovascular: Regular rhythm.  Murmur heard. Respiratory: Breath sounds  normal.  GI: Soft. Bowel sounds are normal. She exhibits no distension. There is tenderness. There is no rebound and no guarding.  Genitourinary:  Genitourinary Comments: Pelvic deferred  Musculoskeletal: She exhibits no edema.  Neurological: She is alert and oriented to person, place, and time.  Skin: Skin is warm and dry.  Psychiatric: She has a normal mood and affect.    Results for orders placed or performed during the hospital encounter of 12/14/17 (from the past 24 hour(s))  Urinalysis, Routine w reflex microscopic     Status: Abnormal   Collection Time: 12/14/17  2:33 PM  Result Value Ref Range   Color, Urine YELLOW YELLOW   APPearance CLOUDY (A) CLEAR   Specific Gravity, Urine 1.020  1.005 - 1.030   pH 6.0 5.0 - 8.0   Glucose, UA NEGATIVE NEGATIVE mg/dL   Hgb urine dipstick NEGATIVE NEGATIVE   Bilirubin Urine NEGATIVE NEGATIVE   Ketones, ur NEGATIVE NEGATIVE mg/dL   Protein, ur 30 (A) NEGATIVE mg/dL   Nitrite NEGATIVE NEGATIVE   Leukocytes, UA LARGE (A) NEGATIVE  Pregnancy, urine     Status: None   Collection Time: 12/14/17  2:33 PM  Result Value Ref Range   Preg Test, Ur NEGATIVE NEGATIVE  Urinalysis, Microscopic (reflex)     Status: Abnormal   Collection Time: 12/14/17  2:33 PM  Result Value Ref Range   RBC / HPF NONE SEEN 0 - 5 RBC/hpf   WBC, UA 6-30 0 - 5 WBC/hpf   Bacteria, UA MANY (A) NONE SEEN   Squamous Epithelial / LPF 0-5 (A) NONE SEEN   Mucus PRESENT    Urine-Other MICROSCOPIC EXAM PERFORMED ON UNCONCENTRATED URINE   Comprehensive metabolic panel     Status: Abnormal   Collection Time: 12/14/17  8:02 PM  Result Value Ref Range   Sodium 134 (L) 135 - 145 mmol/L   Potassium 3.6 3.5 - 5.1 mmol/L   Chloride 101 101 - 111 mmol/L   CO2 25 22 - 32 mmol/L   Glucose, Bld 97 65 - 99 mg/dL   BUN 12 6 - 20 mg/dL   Creatinine, Ser 1.020.66 0.44 - 1.00 mg/dL   Calcium 8.6 (L) 8.9 - 10.3 mg/dL   Total Protein 7.0 6.5 - 8.1 g/dL   Albumin 4.7 3.5 - 5.0 g/dL   AST 16 15 - 41 U/L   ALT 12 (L) 14 - 54 U/L   Alkaline Phosphatase 51 38 - 126 U/L   Total Bilirubin 0.7 0.3 - 1.2 mg/dL   GFR calc non Af Amer >60 >60 mL/min   GFR calc Af Amer >60 >60 mL/min   Anion gap 8 5 - 15  Lipase, blood     Status: None   Collection Time: 12/14/17  8:02 PM  Result Value Ref Range   Lipase 25 11 - 51 U/L  CBC with Diff     Status: Abnormal   Collection Time: 12/14/17  8:02 PM  Result Value Ref Range   WBC 11.2 (H) 4.0 - 10.5 K/uL   RBC 3.73 (L) 3.87 - 5.11 MIL/uL   Hemoglobin 11.8 (L) 12.0 - 15.0 g/dL   HCT 72.533.6 (L) 36.636.0 - 44.046.0 %   MCV 90.1 78.0 - 100.0 fL   MCH 31.6 26.0 - 34.0 pg   MCHC 35.1 30.0 - 36.0 g/dL   RDW 34.712.1 42.511.5 - 95.615.5 %   Platelets 186 150 - 400 K/uL    Neutrophils Relative % 67 %   Neutro Abs  7.6 1.7 - 7.7 K/uL   Lymphocytes Relative 26 %   Lymphs Abs 2.9 0.7 - 4.0 K/uL   Monocytes Relative 7 %   Monocytes Absolute 0.7 0.1 - 1.0 K/uL   Eosinophils Relative 0 %   Eosinophils Absolute 0.0 0.0 - 0.7 K/uL   Basophils Relative 0 %   Basophils Absolute 0.0 0.0 - 0.1 K/uL  hCG, quantitative, pregnancy     Status: None   Collection Time: 12/14/17  8:02 PM  Result Value Ref Range   hCG, Beta Chain, Quant, S <1 <5 mIU/mL  CBC     Status: Abnormal   Collection Time: 12/14/17 11:29 PM  Result Value Ref Range   WBC 10.9 (H) 4.0 - 10.5 K/uL   RBC 3.19 (L) 3.87 - 5.11 MIL/uL   Hemoglobin 10.2 (L) 12.0 - 15.0 g/dL   HCT 16.1 (L) 09.6 - 04.5 %   MCV 90.9 78.0 - 100.0 fL   MCH 32.0 26.0 - 34.0 pg   MCHC 35.2 30.0 - 36.0 g/dL   RDW 40.9 81.1 - 91.4 %   Platelets 163 150 - 400 K/uL  CBC with Differential/Platelet     Status: Abnormal   Collection Time: 12/15/17  5:08 AM  Result Value Ref Range   WBC 8.6 4.0 - 10.5 K/uL   RBC 2.80 (L) 3.87 - 5.11 MIL/uL   Hemoglobin 9.0 (L) 12.0 - 15.0 g/dL   HCT 78.2 (L) 95.6 - 21.3 %   MCV 89.3 78.0 - 100.0 fL   MCH 32.1 26.0 - 34.0 pg   MCHC 36.0 30.0 - 36.0 g/dL   RDW 08.6 57.8 - 46.9 %   Platelets 135 (L) 150 - 400 K/uL   Neutrophils Relative % 73 %   Neutro Abs 6.2 1.7 - 7.7 K/uL   Lymphocytes Relative 23 %   Lymphs Abs 2.0 0.7 - 4.0 K/uL   Monocytes Relative 4 %   Monocytes Absolute 0.4 0.1 - 1.0 K/uL   Eosinophils Relative 0 %   Eosinophils Absolute 0.0 0.0 - 0.7 K/uL   Basophils Relative 0 %   Basophils Absolute 0.0 0.0 - 0.1 K/uL  Type and screen [facility]     Status: None   Collection Time: 12/15/17  5:15 AM  Result Value Ref Range   ABO/RH(D) O POS    Antibody Screen NEG    Sample Expiration      12/18/2017 Performed at [facility], [address], [place], [place] [address]     US Pelvis Transvanginal Non-ob (tv Only)  Result Date: 12/14/2017 CLINICAL  DATA:  27 year old female with abdominal and pelvic pain since yesterday. CT Abdomen and Pelvis today reveals a relatively large volume hemoperitoneum concentrated in the pelvis with asymmetric appearance of the right adnexa. Negative qualitative pregnancy test earlier, but now a quantitative beta HCG has been performed and is negative (<1). EXAM: TRANSABDOMINAL AND TRANSVAGINAL ULTRASOUND OF PELVIS DOPPLER ULTRASOUND OF OVARIES TECHNIQUE: Both transabdominal and transvaginal ultrasound examinations of the pelvis were performed. Transabdominal technique was performed for global imaging of the pelvis including uterus, ovaries, adnexal regions, and pelvic cul-de-sac. It was necessary to proceed with endovaginal exam following the transabdominal exam to visualize the ovaries. Color and duplex Doppler ultrasound was utilized to evaluate blood flow to the ovaries. COMPARISON:  CT Abdomen and Pelvis 2057 hr today. FINDINGS: Uterus Measurements: 9.1 x 3.2 x 5.5 centimeters. No fibroids or other mass visualized. Incidental nabothian cysts (normal variant). Endometrium Thickness: 12 millimeters.  No focal abnormality visualized. Right ovary Difficult to delineate from the complex hemoperitoneum in the pelvis, however, ovarian parenchyma is suspected on ultrasound to be in the same location which was identified by CT corresponding to image 47, with evidence of several small follicles in conjunction with ovarian parenchyma there. The ovarian size cannot be determined, but color and spectral Doppler evaluation does demonstrate both arterial and venous waveforms (image 64). Left ovary Measurements: 2.5 x 1.7 x 2.0 centimeters. Normal. Multiple small follicles. Pulsed Doppler evaluation of both ovaries demonstrates normal low-resistance arterial and venous waveforms. Other findings Large volume hemoperitoneum. IMPRESSION: 1. On ultrasound right ovarian parenchyma is suspected in the same location as suspected on the earlier CT,  surrounded by hemoperitoneum and blood clot. Subsequently, the ovarian size and margins are not delineated, but there are both arterial and venous waveforms detected arguing against ovarian torsion. Considering the negative quantitative beta HCG result, the main diagnostic consideration now is a ruptured hemorrhagic ovarian cyst. However, I do recommend a follow-up pelvic Ultrasound after the blood has resolved (e.g. 6-8 weeks) to re-evaluate the right ovary and exclude an underlying ovarian mass. 2. Large volume hemoperitoneum redemonstrated. 3. Normal uterus and left ovary. Electronically Signed   By: Odessa Fleming M.D.   On: 12/14/2017 22:47   US Pelvis (transabdominal Only)  Result Date: 12/14/2017 CLINICAL DATA:  27 year old female with abdominal and pelvic pain since yesterday. CT Abdomen and Pelvis today reveals a relatively large volume hemoperitoneum concentrated in the pelvis with asymmetric appearance of the right adnexa. Negative qualitative pregnancy test earlier, but now a quantitative beta HCG has been performed and is negative (<1). EXAM: TRANSABDOMINAL AND TRANSVAGINAL ULTRASOUND OF PELVIS DOPPLER ULTRASOUND OF OVARIES TECHNIQUE: Both transabdominal and transvaginal ultrasound examinations of the pelvis were performed. Transabdominal technique was performed for global imaging of the pelvis including uterus, ovaries, adnexal regions, and pelvic cul-de-sac. It was necessary to proceed with endovaginal exam following the transabdominal exam to visualize the ovaries. Color and duplex Doppler ultrasound was utilized to evaluate blood flow to the ovaries. COMPARISON:  CT Abdomen and Pelvis 2057 hr today. FINDINGS: Uterus Measurements: 9.1 x 3.2 x 5.5 centimeters. No fibroids or other mass visualized. Incidental nabothian cysts (normal variant). Endometrium Thickness: 12 millimeters.  No focal abnormality visualized. Right ovary Difficult to delineate from the complex hemoperitoneum in the pelvis, however,  ovarian parenchyma is suspected on ultrasound to be in the same location which was identified by CT corresponding to image 47, with evidence of several small follicles in conjunction with ovarian parenchyma there. The ovarian size cannot be determined, but color and spectral Doppler evaluation does demonstrate both arterial and venous waveforms (image 64). Left ovary Measurements: 2.5 x 1.7 x 2.0 centimeters. Normal. Multiple small follicles. Pulsed Doppler evaluation of both ovaries demonstrates normal low-resistance arterial and venous waveforms. Other findings Large volume hemoperitoneum. IMPRESSION: 1. On ultrasound right ovarian parenchyma is suspected in the same location as suspected on the earlier CT, surrounded by hemoperitoneum and blood clot. Subsequently, the ovarian size and margins are not delineated, but there are both arterial and venous waveforms detected arguing against ovarian torsion. Considering the negative quantitative beta HCG result, the main diagnostic consideration now is a ruptured hemorrhagic ovarian cyst. However, I do recommend a follow-up pelvic Ultrasound after the blood has resolved (e.g. 6-8 weeks) to re-evaluate the right ovary and exclude an underlying ovarian mass. 2. Large volume hemoperitoneum redemonstrated. 3. Normal uterus and left ovary. Electronically Signed   By: Rexene Edison  Margo Aye M.D.   On: 12/14/2017 22:47   Ct Abdomen Pelvis W Contrast  Result Date: 12/14/2017 CLINICAL DATA:  27 year old female with acute abdominal pain for 24 hrs. Negative pregnancy test. EXAM: CT ABDOMEN AND PELVIS WITH CONTRAST TECHNIQUE: Multidetector CT imaging of the abdomen and pelvis was performed using the standard protocol following bolus administration of intravenous contrast. CONTRAST:  ISOVUE-300 IOPAMIDOL (ISOVUE-300) INJECTION 61% COMPARISON:  None. FINDINGS: Lower chest: Lung bases are clear. No pericardial or pleural effusion. Abnormal free fluid throughout the abdomen and pelvis,  described following the reproductive section below. Hepatobiliary: Normal liver enhancement. Negative gallbladder. No biliary ductal enlargement. Pancreas: Negative. Spleen: Negative. Adrenals/Urinary Tract: Normal adrenal glands. Bilateral renal enhancement is normal. No hydronephrosis or perinephric stranding. The proximal ureters are decompressed. Diminutive and unremarkable urinary bladder. Stomach/Bowel: Decompressed rectosigmoid colon. Mildly redundant sigmoid. Decompressed left colon. Negative transverse colon. Mildly redundant hepatic flexure. Negative right colon. A retrocecal appendix appears nondilated. No dilated small bowel. Decompressed stomach and duodenum. Vascular/Lymphatic: Major arterial structures in the abdomen and pelvis are patent and normal. The portal venous system is patent. No lymphadenopathy is identified in the abdomen or pelvis. Reproductive: Hyperdense fluid (described below) mostly obscures both ovaries, but there is an asymmetrically enlarged and heterogeneous appearance of the right adnexa on series 2, image 57. The uterus appears within normal limits. Both ovarian veins are enhancing and appear to be patent. Other: Large amount of pelvic and moderate amount of abdominal free fluid. Fluid density in the abdomen appears fairly simple, especially around the liver, spleen, and in the small bowel mesentery. As you approach the dependent portion of the pelvis, the fluid density increases and most resembles hemoperitoneum (series 2, image 63). Musculoskeletal: Negative. IMPRESSION: 1. Large volume of hemoperitoneum in the pelvis. Moderate volume of more simple appearing abdominal free fluid. Superimposed asymmetric and heterogeneous appearance of the right adnexa. Top differential considerations include ruptured hemorrhagic right ovarian cyst, ruptured ovarian torsion, and ruptured ectopic pregnancy. Recommend quantitative beta HCG to exclude the latter. Recommend follow-up pelvis  ultrasound to attempt further characterization. 2. Study discussed by telephone with Dr. Derwood Kaplan on 12/14/2017 at 21:18 . Electronically Signed   By: Odessa Fleming M.D.   On: 12/14/2017 21:22   US Pelvic Doppler (torsion R/o Or Mass Arterial Flow)  Result Date: 12/14/2017 CLINICAL DATA:  27 year old female with abdominal and pelvic pain since yesterday. CT Abdomen and Pelvis today reveals a relatively large volume hemoperitoneum concentrated in the pelvis with asymmetric appearance of the right adnexa. Negative qualitative pregnancy test earlier, but now a quantitative beta HCG has been performed and is negative (<1). EXAM: TRANSABDOMINAL AND TRANSVAGINAL ULTRASOUND OF PELVIS DOPPLER ULTRASOUND OF OVARIES TECHNIQUE: Both transabdominal and transvaginal ultrasound examinations of the pelvis were performed. Transabdominal technique was performed for global imaging of the pelvis including uterus, ovaries, adnexal regions, and pelvic cul-de-sac. It was necessary to proceed with endovaginal exam following the transabdominal exam to visualize the ovaries. Color and duplex Doppler ultrasound was utilized to evaluate blood flow to the ovaries. COMPARISON:  CT Abdomen and Pelvis 2057 hr today. FINDINGS: Uterus Measurements: 9.1 x 3.2 x 5.5 centimeters. No fibroids or other mass visualized. Incidental nabothian cysts (normal variant). Endometrium Thickness: 12 millimeters.  No focal abnormality visualized. Right ovary Difficult to delineate from the complex hemoperitoneum in the pelvis, however, ovarian parenchyma is suspected on ultrasound to be in the same location which was identified by CT corresponding to image 47, with evidence of several small follicles  in conjunction with ovarian parenchyma there. The ovarian size cannot be determined, but color and spectral Doppler evaluation does demonstrate both arterial and venous waveforms (image 64). Left ovary Measurements: 2.5 x 1.7 x 2.0 centimeters. Normal. Multiple  small follicles. Pulsed Doppler evaluation of both ovaries demonstrates normal low-resistance arterial and venous waveforms. Other findings Large volume hemoperitoneum. IMPRESSION: 1. On ultrasound right ovarian parenchyma is suspected in the same location as suspected on the earlier CT, surrounded by hemoperitoneum and blood clot. Subsequently, the ovarian size and margins are not delineated, but there are both arterial and venous waveforms detected arguing against ovarian torsion. Considering the negative quantitative beta HCG result, the main diagnostic consideration now is a ruptured hemorrhagic ovarian cyst. However, I do recommend a follow-up pelvic Ultrasound after the blood has resolved (e.g. 6-8 weeks) to re-evaluate the right ovary and exclude an underlying ovarian mass. 2. Large volume hemoperitoneum redemonstrated. 3. Normal uterus and left ovary. Electronically Signed   By: Odessa Fleming M.D.   On: 12/14/2017 22:47   CBC Latest Ref Rng & Units 12/15/2017 12/14/2017 12/14/2017  WBC 4.0 - 10.5 K/uL 8.6 10.9(H) 11.2(H)  Hemoglobin 12.0 - 15.0 g/dL 9.0(L) 10.2(L) 11.8(L)  Hematocrit 36.0 - 46.0 % 25.0(L) 29.0(L) 33.6(L)  Platelets 150 - 400 K/uL 135(L) 163 186   Assessment/Plan: Presumed ruptured ovarian cyst Lower abdominal pain Anemia related to blood loss P) observation. CBC x 2( one done at this am here). Advance diet. Switch to oral pain med. If labs remains stable, pt will be sent home and have f/u in office on Tuesday.advised it will take some time for blood to reabsorb. Pt agrees with plan. Percocet for pain  Rani Sisney A Curlie Macken 12/15/2017, 10:33 AM

## 2017-12-17 NOTE — Discharge Summary (Signed)
Physician Discharge Summary  Patient ID: Destiny Lambert MRN: [id number] DOB/AGE: [birth year] 26 y.o.  Admit date: 12/14/2017 Discharge date: 12/17/2017  Admission Diagnoses: hemoperitoneum  Discharge Diagnoses: same Active Problems:   Rupture of ovarian cyst   Discharged Condition: stable  Hospital Course: pt initially had her evaluation done at the [facility] where CT scan showed large amount of  Fluid c/w hemoperitoneum. Pt had serial CBC and subsequently transferred for OBS. Pt remained several hours with stable hgb and no acute change. .    Consults: None  Significant Diagnostic Studies: labs:  CBC    Component Value Date/Time   WBC 6.9 12/15/2017 1053   RBC 2.75 (L) 12/15/2017 1053   HGB 8.7 (L) 12/15/2017 1053   HCT 24.6 (L) 12/15/2017 1053   PLT 130 (L) 12/15/2017 1053   MCV 89.5 12/15/2017 1053   MCH 31.6 12/15/2017 1053   MCHC 35.4 12/15/2017 1053   RDW 12.5 12/15/2017 1053   LYMPHSABS 2.0 12/15/2017 0508   MONOABS 0.4 12/15/2017 0508   EOSABS 0.0 12/15/2017 0508   BASOSABS 0.0 12/15/2017 0508     Treatments: observation  Discharge Exam: Blood pressure 104/62, pulse 88, temperature 98 F (36.7 C), resp. rate 18, height 5\' 5"  (1.651 m), weight 61.2 kg (135 lb), last menstrual period 11/22/2017, SpO2 100 %, not currently breastfeeding. General appearance: alert, cooperative and no distress Back: negative, symmetric, no curvature. ROM normal. No CVA tenderness. Cardio: regular rate and rhythm, S1, S2 normal, no murmur, click, rub or gallop GI: soft, non-tender; bowel sounds normal; no masses,  no organomegaly and non distended, min tenderness Pelvic: deferred Extremities: no edema, redness or tenderness in the calves or thighs  Disposition: 01-Home or Self Care  Discharge Instructions    Activity as tolerated - No restrictions   Complete by:  As directed    Call MD for:  persistant dizziness or light-headedness   Complete by:  As directed     Call MD for:  severe uncontrolled pain   Complete by:  As directed    Call MD for:  temperature >100.4   Complete by:  As directed    Diet - low sodium heart healthy   Complete by:  As directed    May walk up steps   Complete by:  As directed      Allergies as of 12/15/2017   No Known Allergies     Medication List    STOP taking these medications   benzocaine-Menthol 20-0.5 % Aero Commonly known as:  DERMOPLAST   coconut oil Oil     TAKE these medications   acidophilus Caps capsule Take 2 capsules by mouth daily.   calcium carbonate 500 MG chewable tablet Commonly known as:  TUMS - dosed in mg elemental calcium Chew 1-2 tablets by mouth as needed for indigestion or heartburn.   ibuprofen 600 MG tablet Commonly known as:  ADVIL,MOTRIN Take 1 tablet (600 mg total) by mouth every 6 (six) hours.   oxyCODONE-acetaminophen 5-325 MG tablet Commonly known as:  PERCOCET/ROXICET Take 1-2 tablets by mouth every 6 (six) hours as needed for severe pain. What changed:    how much to take  when to take this  reasons to take this   prenatal multivitamin Tabs tablet Take 1 tablet by mouth daily at 12 noon.      Follow-up Information    Marlinda MikeBailey, Tanya, CNM Follow up on 12/18/2017.   Specialty:  Obstetrics and Gynecology Contact information: [id number]  [place] [address] [phone]           Signed: Serita Kyle 12/17/2017, 8:20 PM

## 2017-12-18 MED FILL — Fentanyl Citrate Preservative Free (PF) Inj 100 MCG/2ML: INTRAMUSCULAR | Qty: 2 | Status: AC

## 2018-05-02 IMAGING — US US TRANSVAGINAL NON-OB
1 series · 13 of 25 positions shown · non-contrast
Comparison: CT Abdomen and Pelvis 2679 hr today.

CLINICAL DATA: 26-year-old female with abdominal and pelvic pain
since yesterday.
TECHNIQUE: Both transabdominal and transvaginal ultrasound examinations of the
pelvis were performed. Transabdominal technique was performed for
global imaging of the pelvis including uterus, ovaries, adnexal
regions, and pelvic cul-de-sac.

It was necessary to proceed with endovaginal exam following the
transabdominal exam to visualize the ovaries. Color and duplex
Doppler ultrasound was utilized to evaluate blood flow to the
ovaries.

[Series 1: us transvaginal non-ob · 0.21mm/px · 75 acquisitions, 13 frames shown]
[im 1/75]
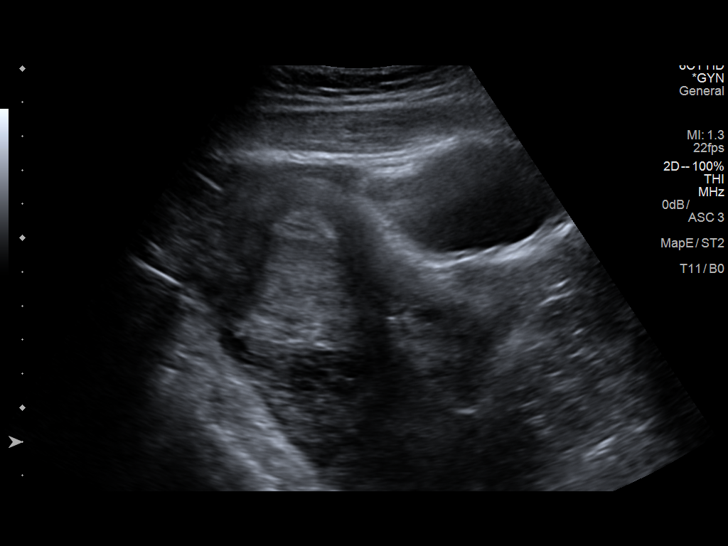
[im 7/75]
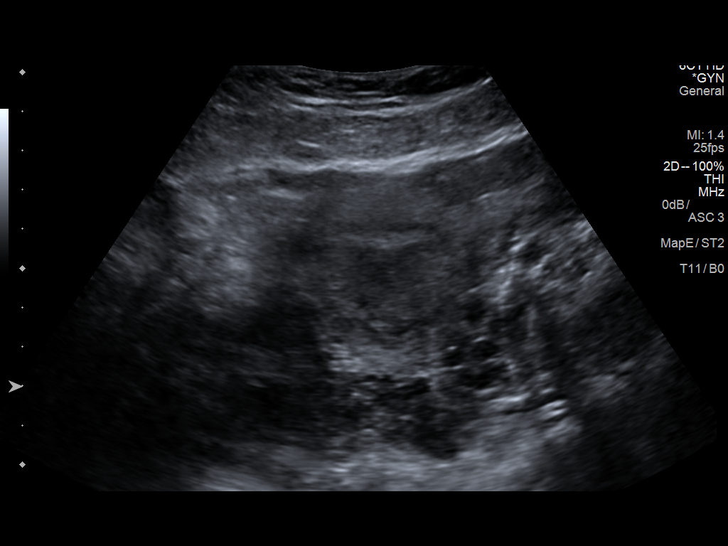
[im 13/75]
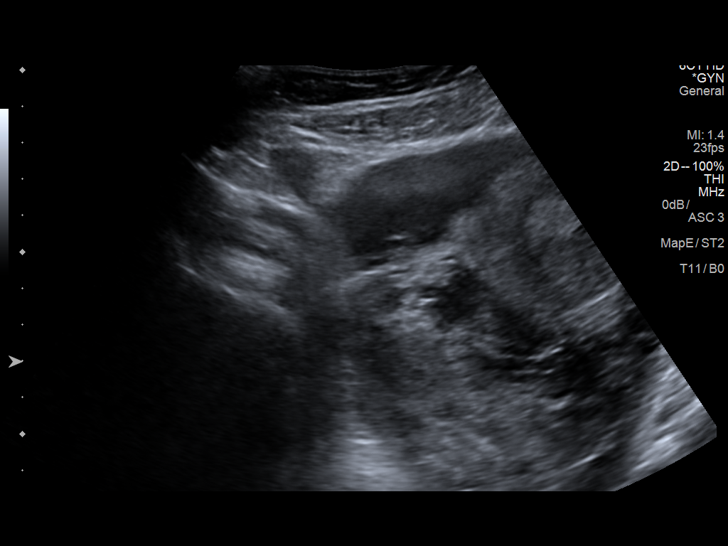
[im 19/75]
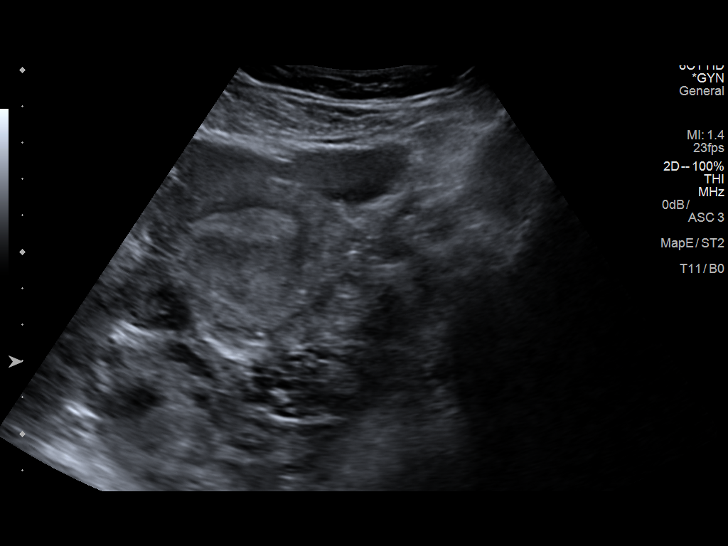
[im 25/75]
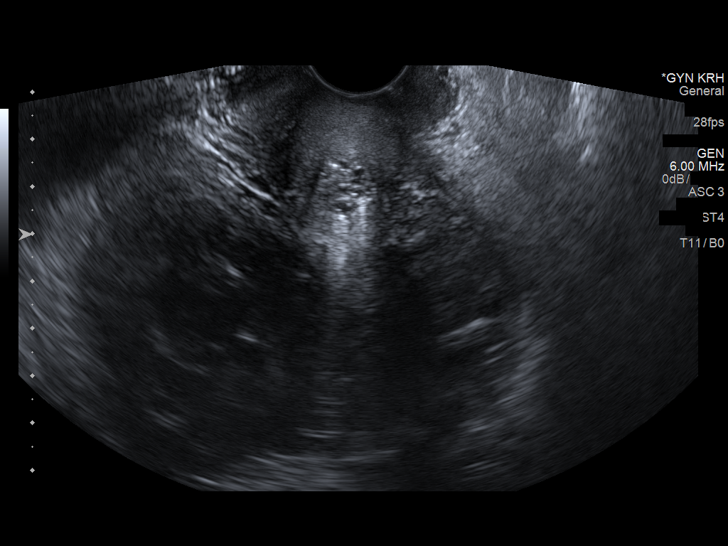
[im 31/75]
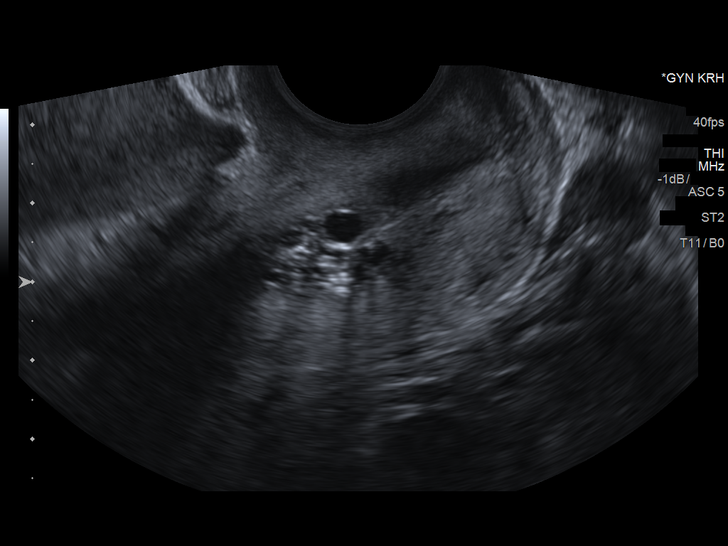
[im 38/75]
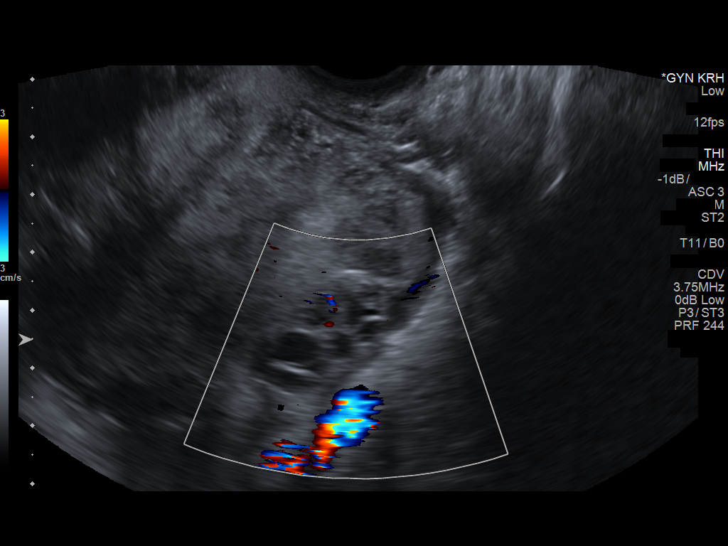
[im 44/75]
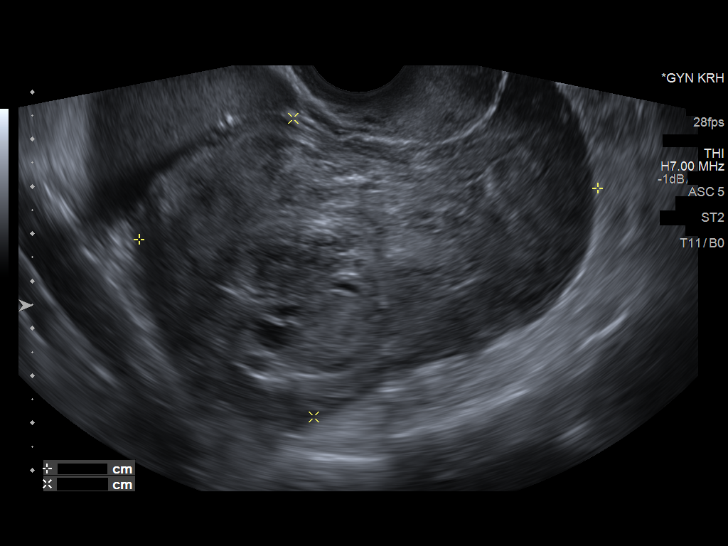
[im 50/75]
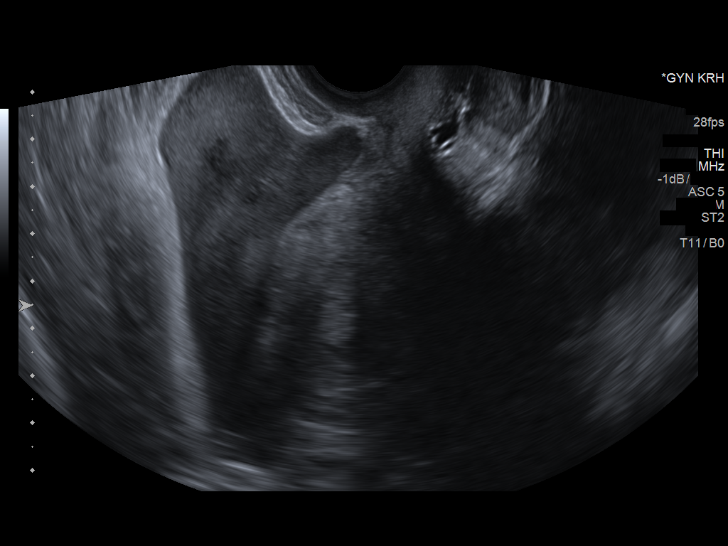
[im 56/75]
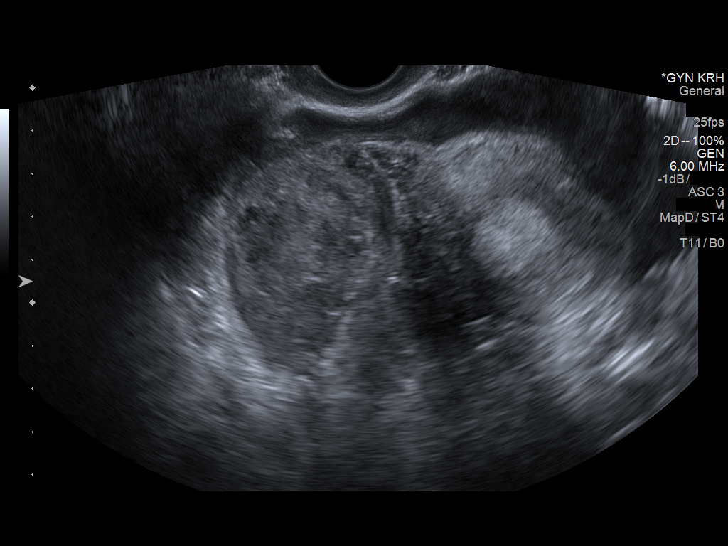
[im 62/75]
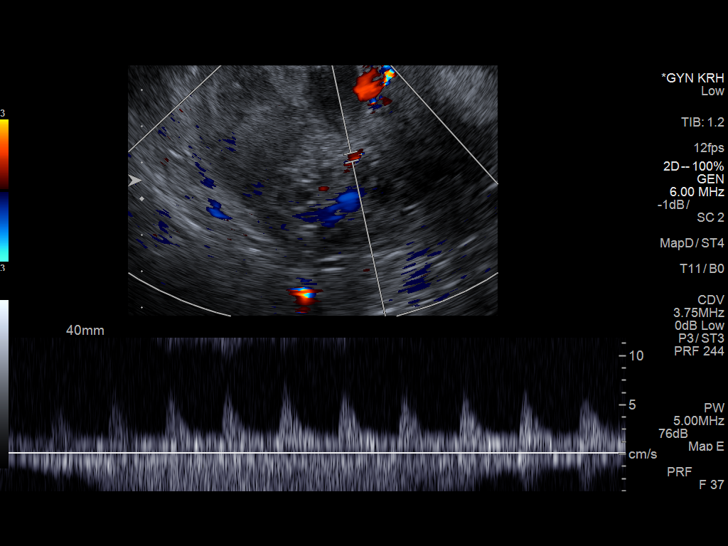
[im 68/75]
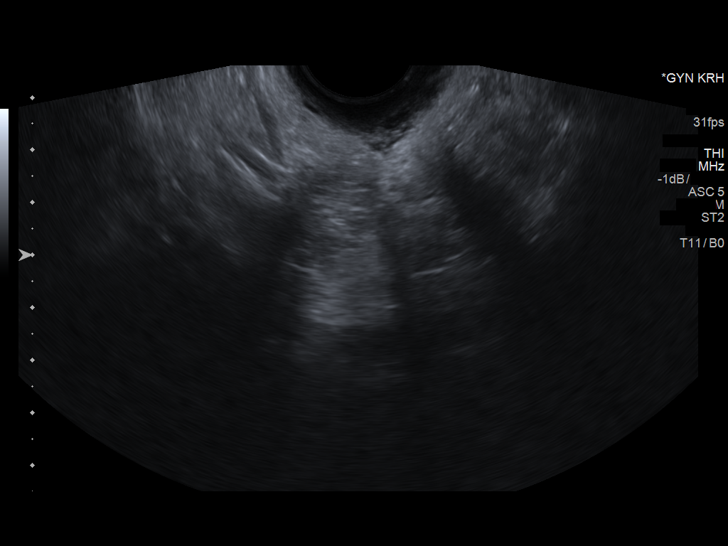
[im 75/75]
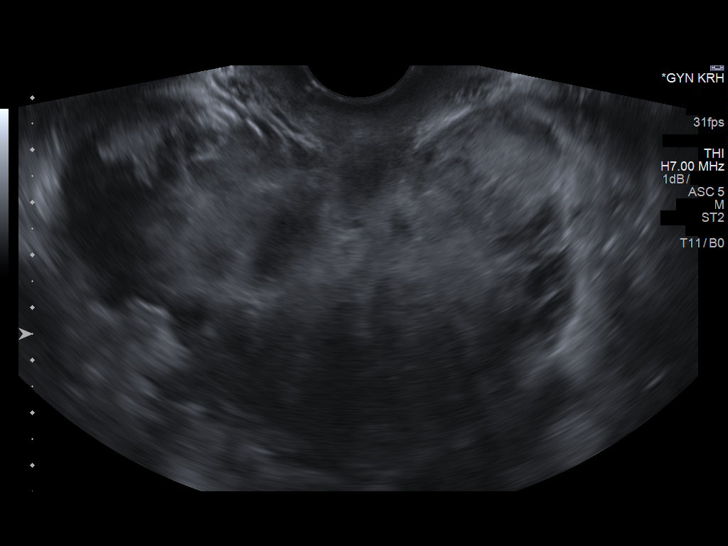

[13 of 25 positions shown; findings below may reference images not displayed]

CT Abdomen and Pelvis today reveals a relatively large volume
hemoperitoneum concentrated in the pelvis with asymmetric appearance
of the right adnexa.

Negative qualitative pregnancy test earlier, but now a quantitative
beta HCG has been performed and is negative (<1).

EXAM:
TRANSABDOMINAL AND TRANSVAGINAL ULTRASOUND OF PELVIS

DOPPLER ULTRASOUND OF OVARIES
FINDINGS: Uterus

Measurements: 9.1 x 3.2 x 5.5 centimeters. No fibroids or other mass
visualized. Incidental nabothian cysts (normal variant).

Endometrium

Thickness: 12 millimeters.  No focal abnormality visualized.

Right ovary

Difficult to delineate from the complex hemoperitoneum in the
pelvis, however, ovarian parenchyma is suspected on ultrasound to be
in the same location which was identified by CT corresponding to
image 47, with evidence of several small follicles in conjunction
with ovarian parenchyma there. The ovarian size cannot be
determined, but color and spectral Doppler evaluation does
demonstrate both arterial and venous waveforms (image 64).

Left ovary

Measurements: 2.5 x 1.7 x 2.0 centimeters. Normal. Multiple small
follicles.

Pulsed Doppler evaluation of both ovaries demonstrates normal
low-resistance arterial and venous waveforms.

Other findings

Large volume hemoperitoneum.
IMPRESSION: 1. On ultrasound right ovarian parenchyma is suspected in the same
location as suspected on the earlier CT, surrounded by
hemoperitoneum and blood clot. Subsequently, the ovarian size and
margins are not delineated, but there are both arterial and venous
waveforms detected arguing against ovarian torsion.
Considering the negative quantitative beta HCG result, the main
diagnostic consideration now is a ruptured hemorrhagic ovarian cyst.
However, I do recommend a follow-up pelvic Ultrasound after the
blood has resolved (e.g. 6-8 weeks) to re-evaluate the right ovary
and exclude an underlying ovarian mass.
2. Large volume hemoperitoneum redemonstrated.
3. Normal uterus and left ovary.

## 2019-10-12 IMAGING — CT CT ABD-PELV W/ CM
2 of 4 series · 15 of 46 positions shown, 17 images · IV contrast (APPLIED)
Comparison: None.

CLINICAL DATA: 26-year-old female with acute abdominal pain for 24
hrs. Negative pregnancy test.

EXAM:
CT ABDOMEN AND PELVIS WITH CONTRAST
TECHNIQUE: Multidetector CT imaging of the abdomen and pelvis was performed
using the standard protocol following bolus administration of
intravenous contrast.
CONTRAST:  100mL 1VLOLR-0BB IOPAMIDOL (1VLOLR-0BB) INJECTION 61%

[Series 2: axial st · axial · 0.71mm/px · z∈[+546,+946]mm · 12 of 88 slices shown, 14 images]
[im 4/88  soft-tissue]
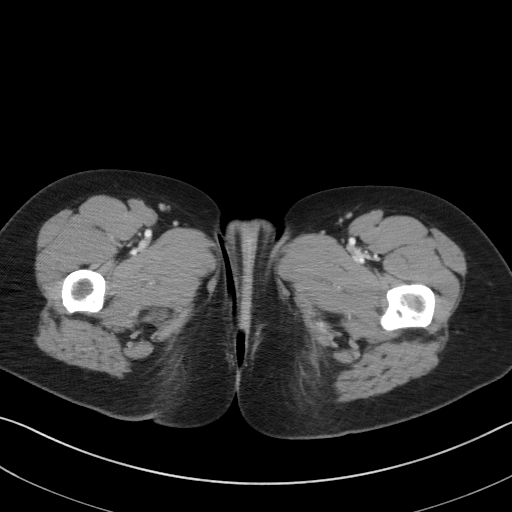
[im 4/88  bone]
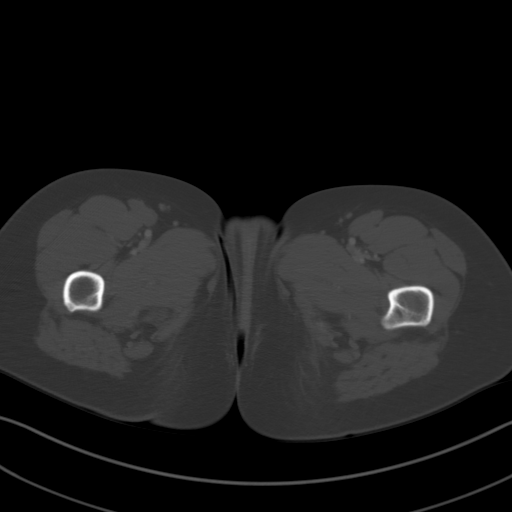
[im 12/88  soft-tissue]
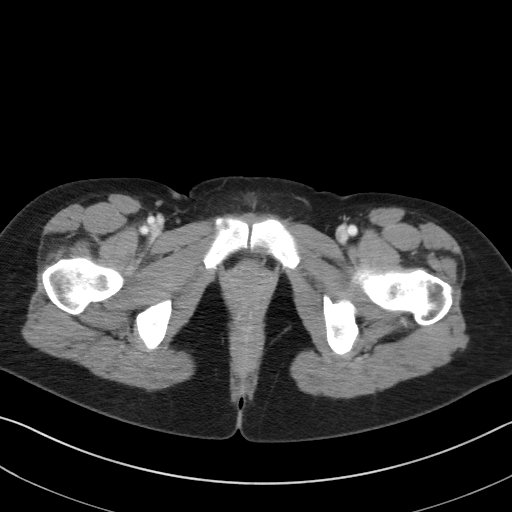
[im 20/88  soft-tissue]
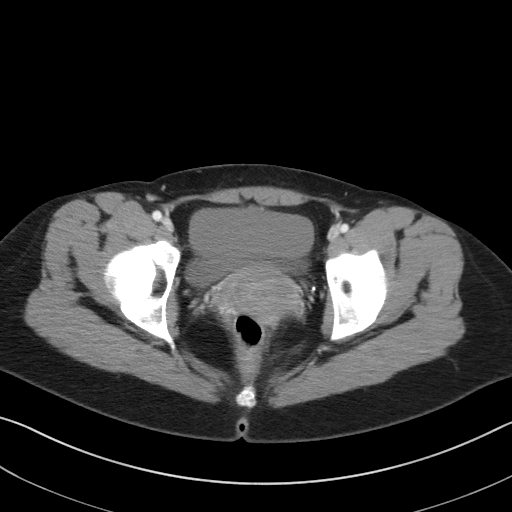
[im 28/88  soft-tissue]
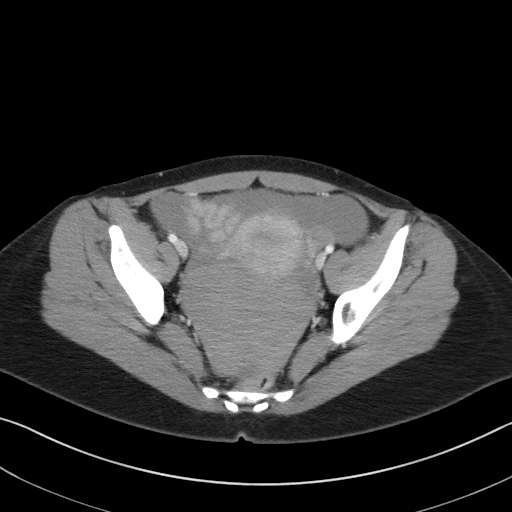
[im 32/88  soft-tissue]
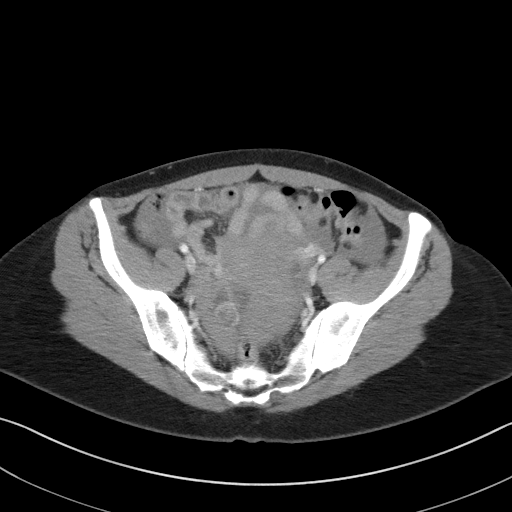
[im 40/88  soft-tissue]
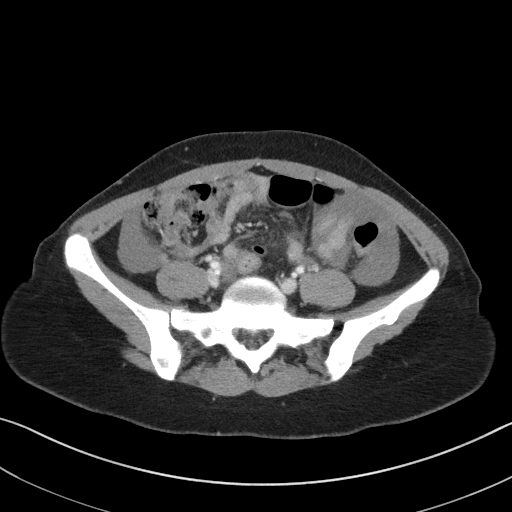
[im 48/88  soft-tissue]
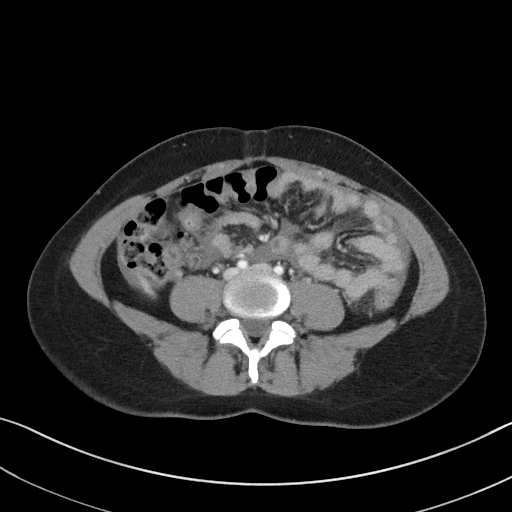
[im 56/88  soft-tissue]
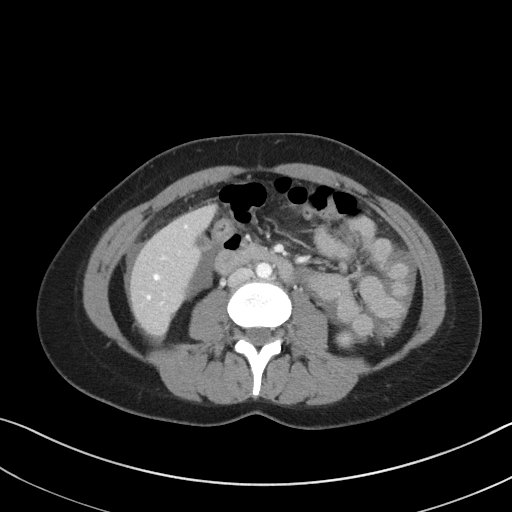
[im 60/88  soft-tissue]
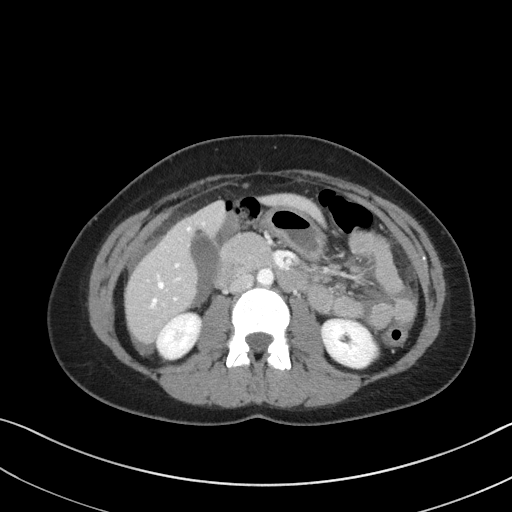
[im 60/88  bone]
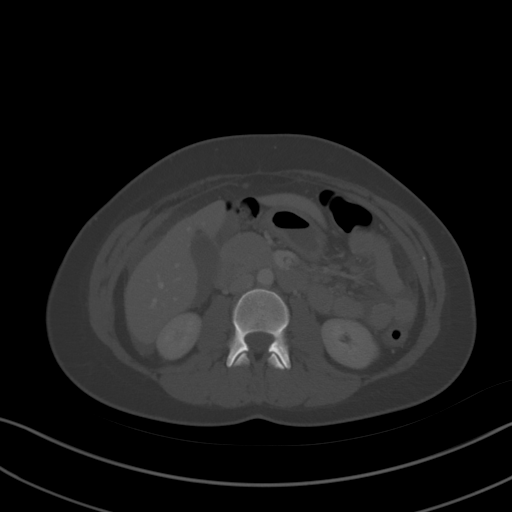
[im 68/88  soft-tissue]
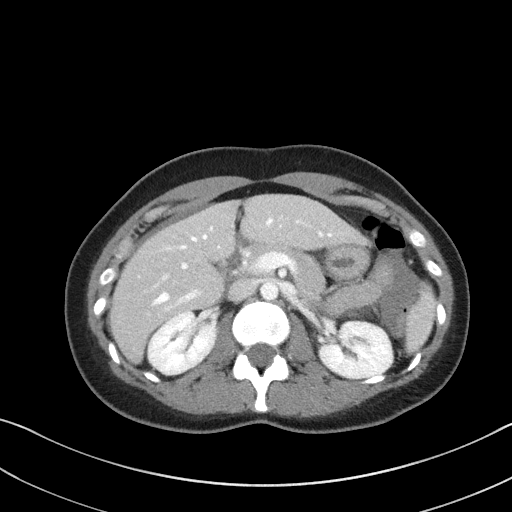
[im 76/88  soft-tissue]
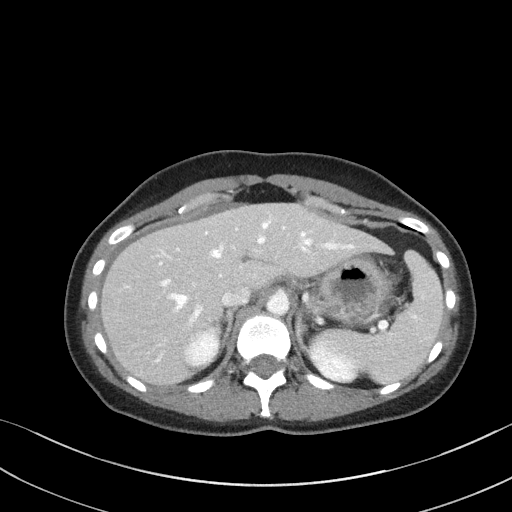
[im 84/88  soft-tissue]
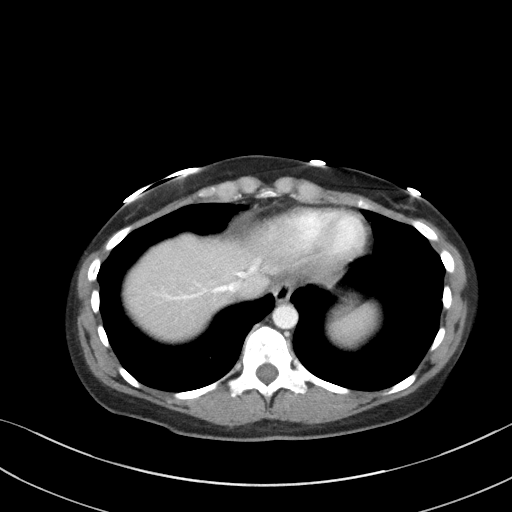

[Series 5: coronal st · coronal · 0.71mm/px · 3 of 84 slices shown]
[im 28/84  soft-tissue]
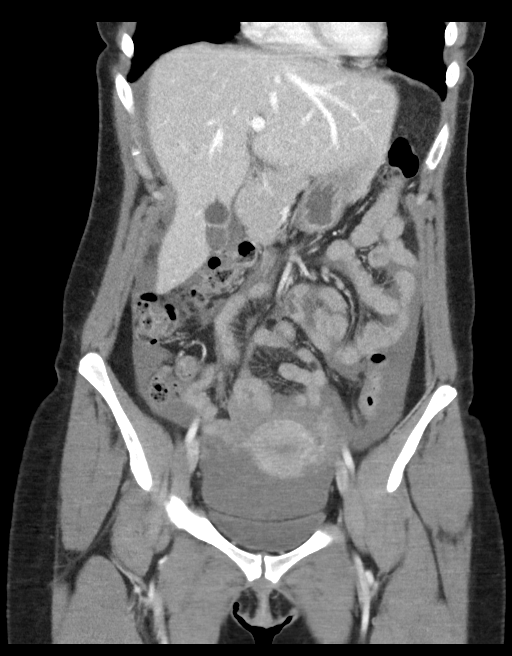
[im 37/84  soft-tissue]
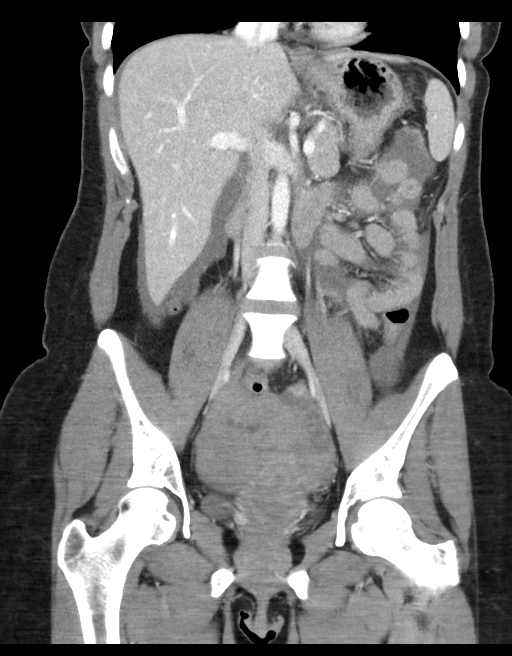
[im 47/84  soft-tissue]
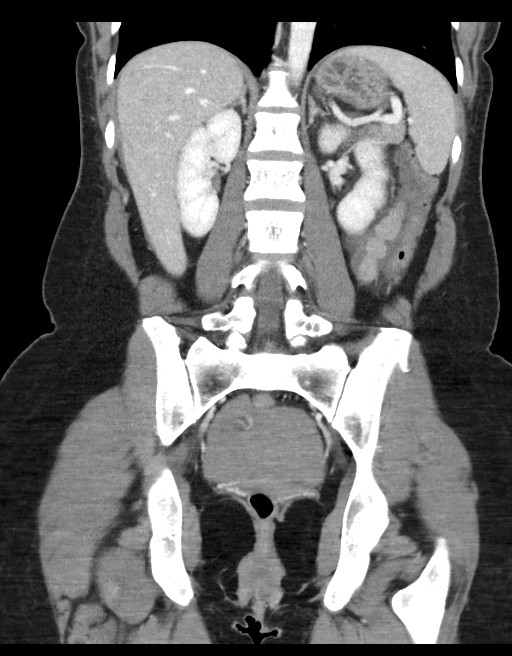

[15 of 46 positions shown; findings below may reference images not displayed]

FINDINGS: Lower chest: Lung bases are clear. No pericardial or pleural
effusion.

Abnormal free fluid throughout the abdomen and pelvis, described
following the reproductive section below.

Hepatobiliary: Normal liver enhancement. Negative gallbladder. No
biliary ductal enlargement.

Pancreas: Negative.

Spleen: Negative.

Adrenals/Urinary Tract: Normal adrenal glands. Bilateral renal
enhancement is normal. No hydronephrosis or perinephric stranding.
The proximal ureters are decompressed.

Diminutive and unremarkable urinary bladder.

Stomach/Bowel: Decompressed rectosigmoid colon. Mildly redundant
sigmoid. Decompressed left colon. Negative transverse colon. Mildly
redundant hepatic flexure. Negative right colon. A retrocecal
appendix appears nondilated. No dilated small bowel. Decompressed
stomach and duodenum.

Vascular/Lymphatic: Major arterial structures in the abdomen and
pelvis are patent and normal. The portal venous system is patent.

No lymphadenopathy is identified in the abdomen or pelvis.

Reproductive: Hyperdense fluid (described below) mostly obscures
both ovaries, but there is an asymmetrically enlarged and
heterogeneous appearance of the right adnexa on series 2, image 57.
The uterus appears within normal limits. Both ovarian veins are
enhancing and appear to be patent.

Other: Large amount of pelvic and moderate amount of abdominal free
fluid. Fluid density in the abdomen appears fairly simple,
especially around the liver, spleen, and in the small bowel
mesentery. As you approach the dependent portion of the pelvis, the
fluid density increases and most resembles hemoperitoneum (series 2,
image 63).

Musculoskeletal: Negative.
IMPRESSION: 1. Large volume of hemoperitoneum in the pelvis. Moderate volume of
more simple appearing abdominal free fluid. Superimposed asymmetric
and heterogeneous appearance of the right adnexa.
Top differential considerations include ruptured hemorrhagic right
ovarian cyst, ruptured ovarian torsion, and ruptured ectopic
pregnancy. Recommend quantitative beta HCG to exclude the latter.
Recommend follow-up pelvis ultrasound to attempt further
characterization.
2. Study discussed by telephone with Dr. JOHANETH LOUBON on 12/14/2017

## 2021-09-29 ENCOUNTER — Ambulatory Visit (HOSPITAL_COMMUNITY)
Admission: RE | Admit: 2021-09-29 | Discharge: 2021-09-29 | Disposition: A | Discharge status: Home / Self Care | Payer: BC Managed Care – PPO | Source: Ambulatory Visit | Attending: Obstetrics and Gynecology | Admitting: Obstetrics and Gynecology

## 2021-09-29 ENCOUNTER — Encounter (HOSPITAL_COMMUNITY)
Admission: RE | Disposition: A | Discharge status: Home / Self Care | Payer: Self-pay | Source: Ambulatory Visit | Attending: Obstetrics and Gynecology

## 2021-09-29 ENCOUNTER — Ambulatory Visit (HOSPITAL_COMMUNITY): Payer: BC Managed Care – PPO | Admitting: Certified Registered Nurse Anesthetist

## 2021-09-29 ENCOUNTER — Encounter (HOSPITAL_COMMUNITY): Payer: Self-pay | Admitting: Obstetrics and Gynecology

## 2021-09-29 ENCOUNTER — Other Ambulatory Visit: Payer: Self-pay

## 2021-09-29 DIAGNOSIS — O00102 Left tubal pregnancy without intrauterine pregnancy: Secondary | ICD-10-CM | POA: Diagnosis present

## 2021-09-29 DIAGNOSIS — D649 Anemia, unspecified: Secondary | ICD-10-CM | POA: Insufficient documentation

## 2021-09-29 HISTORY — PX: DIAGNOSTIC LAPAROSCOPY WITH REMOVAL OF ECTOPIC PREGNANCY: SHX6449

## 2021-09-29 HISTORY — PX: LAPAROSCOPIC UNILATERAL SALPINGECTOMY: SHX5934

## 2021-09-29 LAB — TYPE AND SCREEN
ABO/RH(D): O POS
Antibody Screen: NEGATIVE

## 2021-09-29 LAB — CBC
HCT: 43.9 % (ref 36.0–46.0)
Hemoglobin: 15.1 g/dL — ABNORMAL HIGH (ref 12.0–15.0)
MCH: 31.5 pg (ref 26.0–34.0)
MCHC: 34.4 g/dL (ref 30.0–36.0)
MCV: 91.5 fL (ref 80.0–100.0)
Platelets: 254 10*3/uL (ref 150–400)
RBC: 4.8 MIL/uL (ref 3.87–5.11)
RDW: 12 % (ref 11.5–15.5)
WBC: 10.3 10*3/uL (ref 4.0–10.5)
nRBC: 0 % (ref 0.0–0.2)

## 2021-09-29 SURGERY — LAPAROSCOPY, WITH ECTOPIC PREGNANCY SURGICAL TREATMENT
Anesthesia: General | Site: Abdomen

## 2021-09-29 MED ORDER — ACETAMINOPHEN 500 MG PO TABS
1000.0000 mg | ORAL_TABLET | Freq: Once | ORAL | Status: AC
  Administered 2021-09-29: 1000 mg via ORAL
  Filled 2021-09-29: qty 2

## 2021-09-29 MED ORDER — PHENYLEPHRINE 40 MCG/ML (10ML) SYRINGE FOR IV PUSH (FOR BLOOD PRESSURE SUPPORT)
PREFILLED_SYRINGE | INTRAVENOUS | Status: DC | PRN
  Administered 2021-09-29: 120 ug via INTRAVENOUS

## 2021-09-29 MED ORDER — PROPOFOL 10 MG/ML IV BOLUS
INTRAVENOUS | Status: DC | PRN
  Administered 2021-09-29: 150 mg via INTRAVENOUS

## 2021-09-29 MED ORDER — PROPOFOL 10 MG/ML IV BOLUS
INTRAVENOUS | Status: AC
  Filled 2021-09-29: qty 20

## 2021-09-29 MED ORDER — LACTATED RINGERS IV SOLN: INTRAVENOUS | Status: DC | PRN

## 2021-09-29 MED ORDER — ROCURONIUM BROMIDE 10 MG/ML (PF) SYRINGE
PREFILLED_SYRINGE | INTRAVENOUS | Status: DC | PRN
  Administered 2021-09-29: 40 mg via INTRAVENOUS

## 2021-09-29 MED ORDER — AMISULPRIDE (ANTIEMETIC) 5 MG/2ML IV SOLN: 10.0000 mg | Freq: Once | INTRAVENOUS | Status: DC | PRN

## 2021-09-29 MED ORDER — SCOPOLAMINE 1 MG/3DAYS TD PT72
1.0000 | MEDICATED_PATCH | TRANSDERMAL | Status: DC
  Administered 2021-09-29: 1.5 mg via TRANSDERMAL
  Filled 2021-09-29: qty 1

## 2021-09-29 MED ORDER — FENTANYL CITRATE (PF) 250 MCG/5ML IJ SOLN
INTRAMUSCULAR | Status: AC
  Filled 2021-09-29: qty 5

## 2021-09-29 MED ORDER — SUCCINYLCHOLINE CHLORIDE 200 MG/10ML IV SOSY
PREFILLED_SYRINGE | INTRAVENOUS | Status: DC | PRN
  Administered 2021-09-29: 100 mg via INTRAVENOUS

## 2021-09-29 MED ORDER — DEXAMETHASONE SODIUM PHOSPHATE 10 MG/ML IJ SOLN
INTRAMUSCULAR | Status: DC | PRN
  Administered 2021-09-29: 5 mg via INTRAVENOUS

## 2021-09-29 MED ORDER — BUPIVACAINE HCL (PF) 0.25 % IJ SOLN
INTRAMUSCULAR | Status: DC | PRN
  Administered 2021-09-29: 30 mL

## 2021-09-29 MED ORDER — FENTANYL CITRATE (PF) 100 MCG/2ML IJ SOLN
25.0000 ug | INTRAMUSCULAR | Status: DC | PRN
  Administered 2021-09-29: 25 ug via INTRAVENOUS

## 2021-09-29 MED ORDER — SUGAMMADEX SODIUM 200 MG/2ML IV SOLN
INTRAVENOUS | Status: DC | PRN
Start: 2021-09-29 — End: 2021-09-29
  Administered 2021-09-29: 150 mg via INTRAVENOUS

## 2021-09-29 MED ORDER — FENTANYL CITRATE (PF) 250 MCG/5ML IJ SOLN
INTRAMUSCULAR | Status: DC | PRN
  Administered 2021-09-29 (×2): 50 ug via INTRAVENOUS
  Administered 2021-09-29: 100 ug via INTRAVENOUS
  Administered 2021-09-29: 50 ug via INTRAVENOUS

## 2021-09-29 MED ORDER — LACTATED RINGERS IV SOLN: INTRAVENOUS | Status: DC

## 2021-09-29 MED ORDER — MIDAZOLAM HCL 5 MG/5ML IJ SOLN
INTRAMUSCULAR | Status: DC | PRN
  Administered 2021-09-29: 2 mg via INTRAVENOUS

## 2021-09-29 MED ORDER — LIDOCAINE 2% (20 MG/ML) 5 ML SYRINGE
INTRAMUSCULAR | Status: DC | PRN
  Administered 2021-09-29: 60 mg via INTRAVENOUS

## 2021-09-29 MED ORDER — CHLORHEXIDINE GLUCONATE 0.12 % MT SOLN
15.0000 mL | Freq: Once | OROMUCOSAL | Status: AC
  Administered 2021-09-29: 15 mL via OROMUCOSAL
  Filled 2021-09-29: qty 15

## 2021-09-29 MED ORDER — KETOROLAC TROMETHAMINE 30 MG/ML IJ SOLN
INTRAMUSCULAR | Status: DC | PRN
  Administered 2021-09-29: 30 mg via INTRAVENOUS

## 2021-09-29 MED ORDER — FENTANYL CITRATE (PF) 100 MCG/2ML IJ SOLN
INTRAMUSCULAR | Status: AC
  Filled 2021-09-29: qty 2

## 2021-09-29 MED ORDER — ORAL CARE MOUTH RINSE: 15.0000 mL | Freq: Once | OROMUCOSAL | Status: AC

## 2021-09-29 MED ORDER — MIDAZOLAM HCL 2 MG/2ML IJ SOLN
INTRAMUSCULAR | Status: AC
  Filled 2021-09-29: qty 2

## 2021-09-29 MED ORDER — ONDANSETRON HCL 4 MG/2ML IJ SOLN
INTRAMUSCULAR | Status: DC | PRN
  Administered 2021-09-29: 4 mg via INTRAVENOUS

## 2021-09-29 MED ORDER — BUPIVACAINE HCL (PF) 0.25 % IJ SOLN
INTRAMUSCULAR | Status: AC
  Filled 2021-09-29: qty 30

## 2021-09-29 SURGICAL SUPPLY — 39 items
BAG COUNTER SPONGE SURGICOUNT (BAG) ×3 IMPLANT
CABLE HIGH FREQUENCY MONO STRZ (ELECTRODE) IMPLANT
CATH ROBINSON RED A/P 16FR (CATHETERS) ×3 IMPLANT
DERMABOND ADVANCED (GAUZE/BANDAGES/DRESSINGS) ×1
DERMABOND ADVANCED .7 DNX12 (GAUZE/BANDAGES/DRESSINGS) ×2 IMPLANT
DRSG OPSITE POSTOP 3X4 (GAUZE/BANDAGES/DRESSINGS) ×3 IMPLANT
GLOVE SURG LTX SZ6.5 (GLOVE) ×3 IMPLANT
GLOVE SURG POLYISO LF SZ7.5 (GLOVE) ×3 IMPLANT
GLOVE SURG UNDER POLY LF SZ7 (GLOVE) ×12 IMPLANT
GOWN STRL REUS W/ TWL LRG LVL3 (GOWN DISPOSABLE) ×8 IMPLANT
GOWN STRL REUS W/TWL LRG LVL3 (GOWN DISPOSABLE) ×4
IRRIGATION STRYKERFLOW (MISCELLANEOUS) IMPLANT
IRRIGATOR STRYKERFLOW (MISCELLANEOUS)
KIT TURNOVER KIT B (KITS) ×3 IMPLANT
LIGASURE VESSEL 5MM BLUNT TIP (ELECTROSURGICAL) ×3 IMPLANT
MANIPULATOR UTERINE 4.5 ZUMI (MISCELLANEOUS) ×3 IMPLANT
NEEDLE INSUFFLATION 14GA 120MM (NEEDLE) ×3 IMPLANT
PACK LAPAROSCOPY BASIN (CUSTOM PROCEDURE TRAY) ×3 IMPLANT
PACK TRENDGUARD 450 HYBRID PRO (MISCELLANEOUS) ×2 IMPLANT
POUCH SPECIMEN RETRIEVAL 10MM (ENDOMECHANICALS) IMPLANT
PROTECTOR NERVE ULNAR (MISCELLANEOUS) ×3 IMPLANT
SCISSORS LAP 5X35 DISP (ENDOMECHANICALS) IMPLANT
SET TUBE SMOKE EVAC HIGH FLOW (TUBING) ×3 IMPLANT
SHEARS HARMONIC ACE PLUS 36CM (ENDOMECHANICALS) IMPLANT
SLEEVE ENDOPATH XCEL 5M (ENDOMECHANICALS) ×3 IMPLANT
SUT VIC AB 4-0 SH 27 (SUTURE) ×1
SUT VIC AB 4-0 SH 27XANBCTRL (SUTURE) ×2 IMPLANT
SUT VICRYL 0 UR6 27IN ABS (SUTURE) ×3 IMPLANT
SUT VICRYL 4-0 PS2 18IN ABS (SUTURE) ×3 IMPLANT
SYR 5ML LL (SYRINGE) ×3 IMPLANT
SYS RETRIEVAL 5MM INZII UNIV (BASKET) ×3
SYSTEM RETRIEVL 5MM INZII UNIV (BASKET) ×2 IMPLANT
TOWEL GREEN STERILE FF (TOWEL DISPOSABLE) ×6 IMPLANT
TRAY FOLEY W/BAG SLVR 14FR (SET/KITS/TRAYS/PACK) ×3 IMPLANT
TRENDGUARD 450 HYBRID PRO PACK (MISCELLANEOUS) ×3
TROCAR XCEL NON BLADE 8MM B8LT (ENDOMECHANICALS) ×3 IMPLANT
TROCAR XCEL NON-BLD 11X100MML (ENDOMECHANICALS) IMPLANT
TROCAR XCEL NON-BLD 5MMX100MML (ENDOMECHANICALS) ×3 IMPLANT
WARMER LAPAROSCOPE (MISCELLANEOUS) ×3 IMPLANT

## 2021-09-29 NOTE — Anesthesia Postprocedure Evaluation (Signed)
Anesthesia Post Note  Patient: Destiny Lambert  Procedure(s) Performed: DIAGNOSTIC LAPAROSCOPY WITH REMOVAL OF ECTOPIC PREGNANCY (Abdomen) LAPAROSCOPIC UNILATERAL SALPINGECTOMY (Left: Abdomen)     Patient location during evaluation: PACU Anesthesia Type: General Level of consciousness: awake and alert Pain management: pain level controlled Vital Signs Assessment: post-procedure vital signs reviewed and stable Respiratory status: spontaneous breathing, nonlabored ventilation, respiratory function stable and patient connected to nasal cannula oxygen Cardiovascular status: blood pressure returned to baseline and stable Postop Assessment: no apparent nausea or vomiting Anesthetic complications: no   No notable events documented.  Last Vitals:  Vitals:   09/29/21 1250 09/29/21 1310  BP: 108/67 113/72  Pulse: 77 63  Resp: 15 11  Temp:  36.6 C  SpO2: 100% 100%    Last Pain:  Vitals:   09/29/21 1310  TempSrc:   PainSc: 4                  Kennieth Rad

## 2021-09-29 NOTE — Op Note (Signed)
Operative Note  Destiny Lambert 06/17/1991 [id number]   PROCEDURE: diagnostic laparoscopy, left salpingectomy and removal of ectopic pregnancy  PRE-OPERATIVE DIAGNOSIS: left ectopic pregnancy  POST-OPERATIVE DIAGNOSIS: left ectopic pregnancy  SURGEON: Dr. Clance Boll, DO  ASSISTANT: RNFA  FINDINGS: small amount of blood in the pelvis, enlarged left fallopian tube with evidence of ectopic pregnancy within the tube and some bleeding from the distal end, normal appearing left ovary, normal appearing right fallopian tube and ovary. Uterus with normal size and contour, however the posterior cul de sac did appear hypervascular with a small brown powder burn lesion along the left uterosacral ligament. Grossly normal appearing upper abdomen including liver edge  SPECIMENS: left fallopian tube and ectopic   EBL: minimal  FLUIDS: per anesthesia  COMPLICATIONS: None  PROCEDURE IN DETAIL:   After the patient was appropriately consented in the holding area, she was taken to the operating room where general anesthesia was administered without complications. The patient was placed in the dorsal lithotomy position. Bilateral arms were tucked with the appropriate barrier padding. The patient was prepped and draped in the usual sterile fashion. The bladder was drained with a catheter. An appropriate time out was performed that verified the correct patient, procedure, and surgical team.   A scalpel was used to make a small transverse skin incision just superior to the umbilicus. The Veress needle was advanced through the skin incision until 2 clicks were appreciated. The gas was connected and turned on and a low opening pressure was noted. Pneumoperitoneum was achieved without complications. A 5 mm laparoscope and trocar sleeve was inserted through the umbilical incision and the abdomen was entered under direct visualization. Inspection of the abdomen revealed the findings as noted above and no obvious  injuries noted. The patient was placed in Trendelenburg positioning to facilitate pelvic visualization. Lower quadrant ports were placed bilaterally at points approximately 2 cm superior and 2 cm medial to the ASIS. A 10 mm trocar was placed in the left lower quadrant and a 5 mm placed in the right lower quadrant under laparoscopic visualization. The ectopic pregnancy was noted within the left fallopian tube. The LigaSure device was used to sequentially cauterize and cut the mesosalpinx just inferior to the level of the tube. The tube was then transected at the cornua. The 5 mm trocar was replaced with an 8 mm trocar on the left to accommodate the 5 mm endoscopic specimen bag. The bag was introduced and the specimen placed within the bag which was then closed and removed through the skin incision. The specimen was sent for final pathology. A final inspection of the surgical site revealed excellent hemostasis. Irrigation and suction of the pelvis performed. The pressure was reduced and the patient laid flat and continued hemostasis noted. The gas was released from the abdomen. The skin incisions were closed with 4-0 Vicryl and skin glue. Local anesthetic using 0.25% Marcaine was injected at the incision sites. The patient tolerated the procedure well and was taken to the recovery room in stable condition. All lap, needle, and instrument counts were correct.  Destiny Lambert A Destiny Lambert 09/29/21 1:32 PM

## 2021-09-29 NOTE — Brief Op Note (Signed)
09/29/2021  12:30 PM  PATIENT:  Destiny Lambert  30 y.o. female  PRE-OPERATIVE DIAGNOSIS:  ecoptic pregnancy  POST-OPERATIVE DIAGNOSIS:  ecoptic pregnancy  PROCEDURE:  Procedure(s): DIAGNOSTIC LAPAROSCOPY WITH REMOVAL OF ECTOPIC PREGNANCY (N/A)  SURGEON:  Surgeon(s) and Role:    * Muaz Shorey A, DO - Primary  PHYSICIAN ASSISTANT:   ASSISTANTS: RNFA   ANESTHESIA:   general  EBL:  15 mL   BLOOD ADMINISTERED:none  DRAINS: none   LOCAL MEDICATIONS USED:  MARCAINE     SPECIMEN:  Source of Specimen:  left fallopian tube and ectopic  DISPOSITION OF SPECIMEN:  PATHOLOGY  COUNTS:  YES  TOURNIQUET:  * No tourniquets in log *  DICTATION: .Note written in EPIC  PLAN OF CARE: Discharge to home after PACU  PATIENT DISPOSITION:  PACU - hemodynamically stable.   Delay start of Pharmacological VTE agent (>24hrs) due to surgical blood loss or risk of bleeding: no  Nadav Swindell A Ilyanna Baillargeon 09/29/21 12:31 PM

## 2021-09-29 NOTE — Anesthesia Procedure Notes (Signed)
Procedure Name: Intubation Date/Time: 09/29/2021 11:25 AM Performed by: Aundria Rud, CRNA Pre-anesthesia Checklist: Patient identified, Emergency Drugs available, Suction available and Patient being monitored Patient Re-evaluated:Patient Re-evaluated prior to induction Oxygen Delivery Method: Circle System Utilized Preoxygenation: Pre-oxygenation with 100% oxygen Induction Type: IV induction, Rapid sequence and Cricoid Pressure applied Laryngoscope Size: Miller and 2 Grade View: Grade I Tube type: Oral Tube size: 7.0 mm Number of attempts: 1 Airway Equipment and Method: Stylet Placement Confirmation: ETT inserted through vocal cords under direct vision, positive ETCO2 and breath sounds checked- equal and bilateral Secured at: 22 cm Tube secured with: Tape Dental Injury: Teeth and Oropharynx as per pre-operative assessment

## 2021-09-29 NOTE — H&P (Signed)
Destiny Lambert is an 30 y.o. female with ectopic pregnancy presenting for surgery.  Destiny Lambert is a 30Y G2P1001 LMP 08/19/21 who had presented to the office earlier this week with VB and LLQ pain. Pain had resolved and VB found to be minimal. She had a benign abdominal/pelvic exam on 11/29 with beta hCG quant of 315. This was compared to beta hCG quant from 11/23 of 310 and was scheduled to return to office today for follow up beta quant and [place] evaluation. The beta hCG from this morning is still pending, however ectopic was confirmed on US findings. US shows a left adnexal ectopic pregnancy with cardiac activity and surrounding complex fluid extending into the posterior cul de sac suspicious for ruptured or rupture in process. Destiny Lambert hemodynamically stable currently, but given these findings, diagnostic laparoscopy with removal of ectopic and likely left salpingectomy advised.   Destiny Lambert has history of a previous uncomplicated vaginal delivery. No previous abdominal or pelvic surgeries. No previous abdominal/pelvic infections or STD or other tubal risk factors. She is otherwise healthy with no significant medical problems, and is not on any daily prescription medications.   Menstrual History: No LMP recorded.    Past Medical History:  Diagnosis Date   Anemia    Headache     Past Surgical History:  Procedure Laterality Date   MOUTH SURGERY     TOOTH EXTRACTION      Family History  Problem Relation Age of Onset   Alcohol abuse Neg Hx    Arthritis Neg Hx    Asthma Neg Hx    Birth defects Neg Hx    Cancer Neg Hx    COPD Neg Hx    Depression Neg Hx    Diabetes Neg Hx    Drug abuse Neg Hx    Early death Neg Hx    Hearing loss Neg Hx    Heart disease Neg Hx    Hyperlipidemia Neg Hx    Hypertension Neg Hx    Kidney disease Neg Hx    Learning disabilities Neg Hx    Mental illness Neg Hx    Mental retardation Neg Hx    Miscarriages / Stillbirths Neg Hx    Stroke Neg Hx    Vision loss  Neg Hx    Varicose Veins Neg Hx     Social History:  reports that she has never smoked. She has never used smokeless tobacco. She reports current alcohol use. She reports that she does not use drugs.  Allergies: No Known Allergies  Medications Prior to Admission  Medication Sig Dispense Refill Last Dose   acidophilus (RISAQUAD) CAPS capsule Take 2 capsules by mouth daily. 60 capsule 3    calcium carbonate (TUMS - DOSED IN MG ELEMENTAL CALCIUM) 500 MG chewable tablet Chew 1-2 tablets by mouth as needed for indigestion or heartburn.      ibuprofen (ADVIL,MOTRIN) 600 MG tablet Take 1 tablet (600 mg total) by mouth every 6 (six) hours. 30 tablet 0    oxyCODONE-acetaminophen (PERCOCET/ROXICET) 5-325 MG tablet Take 1-2 tablets by mouth every 6 (six) hours as needed for severe pain. 30 tablet 0    Prenatal Vit-Fe Fumarate-FA (PRENATAL MULTIVITAMIN) TABS tablet Take 1 tablet by mouth daily at 12 noon.       Review of Systems  Gastrointestinal:  Positive for abdominal pain.  Genitourinary:  Positive for vaginal bleeding.  All other systems reviewed and are negative.  There were no vitals taken for this visit. Physical Exam Vitals reviewed.  Constitutional:      Appearance: Normal appearance.  HENT:     Head: Normocephalic.  Cardiovascular:     Rate and Rhythm: Normal rate.  Pulmonary:     Effort: Pulmonary effort is normal.  Abdominal:     Palpations: Abdomen is soft.     Tenderness: There is abdominal tenderness.  Skin:    General: Skin is warm and dry.  Neurological:     General: No focal deficit present.     Mental Status: She is alert and oriented to person, place, and time.  Psychiatric:        Mood and Affect: Mood normal.        Behavior: Behavior normal.    Assessment/Plan:  30Y G2P1001 with left ectopic pregnancy concerning for potential rupture, consented for diagnostic laparoscopy, removal of ectopic pregnancy, and left salpingectomy  Destiny Lambert has been consented on  the surgical risks including but not limited to bleeding, infection, damage to surrounding structures, and possible impact on future fertility. She is agreeable to blood transfusion if indicated  -Admit to OR -No pregnancy testing indicated -No antibiotics indicated -SCD VTE ppx -Type and screen pending, historical RH POS, no Rhogam indicated -Anticipate DC home today and plan for outpatient follow up within 2 weeks. DC instructions reviewed with Destiny Lambert   Anthem Frazer A Vinal Rosengrant 09/29/2021, 10:03 AM

## 2021-09-29 NOTE — Transfer of Care (Signed)
Immediate Anesthesia Transfer of Care Note  Patient: Destiny Lambert  Procedure(s) Performed: DIAGNOSTIC LAPAROSCOPY WITH REMOVAL OF ECTOPIC PREGNANCY (Abdomen) LAPAROSCOPIC UNILATERAL SALPINGECTOMY (Left: Abdomen)  Patient Location: PACU  Anesthesia Type:General  Level of Consciousness: drowsy and patient cooperative  Airway & Oxygen Therapy: Patient Spontanous Breathing and Patient connected to nasal cannula oxygen  Post-op Assessment: Report given to RN, Post -op Vital signs reviewed and stable and Patient moving all extremities X 4  Post vital signs: Reviewed and stable  Last Vitals:  Vitals Value Taken Time  BP 94/70 09/29/21 1235  Temp 36.6 C 09/29/21 1235  Pulse 77 09/29/21 1237  Resp 7 09/29/21 1237  SpO2 100 % 09/29/21 1237  Vitals shown include unvalidated device data.  Last Pain:  Vitals:   09/29/21 1235  TempSrc:   PainSc: 0-No pain         Complications: No notable events documented.

## 2021-09-29 NOTE — Anesthesia Preprocedure Evaluation (Signed)
Anesthesia Evaluation  Patient identified by MRN, date of birth, ID band Patient awake    Reviewed: Allergy & Precautions, NPO status , Patient's Chart, lab work & pertinent test results  Airway Mallampati: II  TM Distance: >3 FB Neck ROM: Full    Dental  (+) Dental Advisory Given   Pulmonary neg pulmonary ROS,    breath sounds clear to auscultation       Cardiovascular negative cardio ROS   Rhythm:Regular Rate:Normal     Neuro/Psych negative neurological ROS     GI/Hepatic negative GI ROS, Neg liver ROS,   Endo/Other  negative endocrine ROS  Renal/GU negative Renal ROS     Musculoskeletal   Abdominal   Peds  Hematology  (+) anemia ,   Anesthesia Other Findings   Reproductive/Obstetrics                             Anesthesia Physical Anesthesia Plan  ASA: 2  Anesthesia Plan: General   Post-op Pain Management: Tylenol PO (pre-op) and Toradol IV (intra-op)   Induction: Intravenous and Rapid sequence  PONV Risk Score and Plan: 4 or greater and Dexamethasone, Ondansetron, Midazolam, Scopolamine patch - Pre-op and Treatment may vary due to age or medical condition  Airway Management Planned: Oral ETT  Additional Equipment: None  Intra-op Plan:   Post-operative Plan: Extubation in OR  Informed Consent: I have reviewed the patients History and Physical, chart, labs and discussed the procedure including the risks, benefits and alternatives for the proposed anesthesia with the patient or authorized representative who has indicated his/her understanding and acceptance.     Dental advisory given  Plan Discussed with: CRNA  Anesthesia Plan Comments:         Anesthesia Quick Evaluation

## 2021-09-30 ENCOUNTER — Encounter (HOSPITAL_COMMUNITY): Payer: Self-pay | Admitting: Obstetrics and Gynecology

## 2021-10-03 LAB — SURGICAL PATHOLOGY

## 2022-06-01 ENCOUNTER — Other Ambulatory Visit: Payer: Self-pay

## 2022-06-01 ENCOUNTER — Emergency Department (HOSPITAL_COMMUNITY): Payer: BC Managed Care – PPO

## 2022-06-01 ENCOUNTER — Observation Stay (HOSPITAL_COMMUNITY)
Admission: EM | Admit: 2022-06-01 | Discharge: 2022-06-02 | Disposition: A | Discharge status: Home / Self Care | Payer: BC Managed Care – PPO | Attending: Obstetrics and Gynecology | Admitting: Obstetrics and Gynecology

## 2022-06-01 DIAGNOSIS — O009 Unspecified ectopic pregnancy without intrauterine pregnancy: Secondary | ICD-10-CM | POA: Diagnosis present

## 2022-06-01 DIAGNOSIS — O0081 Other ectopic pregnancy with intrauterine pregnancy: Principal | ICD-10-CM

## 2022-06-01 DIAGNOSIS — O3680X0 Pregnancy with inconclusive fetal viability, not applicable or unspecified: Secondary | ICD-10-CM | POA: Insufficient documentation

## 2022-06-01 DIAGNOSIS — N939 Abnormal uterine and vaginal bleeding, unspecified: Secondary | ICD-10-CM

## 2022-06-01 DIAGNOSIS — K661 Hemoperitoneum: Secondary | ICD-10-CM | POA: Diagnosis not present

## 2022-06-01 LAB — CBC WITH DIFFERENTIAL/PLATELET
Abs Immature Granulocytes: 0.02 10*3/uL (ref 0.00–0.07)
Basophils Absolute: 0 10*3/uL (ref 0.0–0.1)
Basophils Relative: 0 %
Eosinophils Absolute: 0 10*3/uL (ref 0.0–0.5)
Eosinophils Relative: 0 %
HCT: 38.1 % (ref 36.0–46.0)
Hemoglobin: 13.1 g/dL (ref 12.0–15.0)
Immature Granulocytes: 0 %
Lymphocytes Relative: 18 %
Lymphs Abs: 2.1 10*3/uL (ref 0.7–4.0)
MCH: 32 pg (ref 26.0–34.0)
MCHC: 34.4 g/dL (ref 30.0–36.0)
MCV: 93.2 fL (ref 80.0–100.0)
Monocytes Absolute: 0.9 10*3/uL (ref 0.1–1.0)
Monocytes Relative: 7 %
Neutro Abs: 8.8 10*3/uL — ABNORMAL HIGH (ref 1.7–7.7)
Neutrophils Relative %: 75 %
Platelets: 217 10*3/uL (ref 150–400)
RBC: 4.09 MIL/uL (ref 3.87–5.11)
RDW: 11.8 % (ref 11.5–15.5)
WBC: 11.8 10*3/uL — ABNORMAL HIGH (ref 4.0–10.5)
nRBC: 0 % (ref 0.0–0.2)

## 2022-06-01 LAB — URINALYSIS, ROUTINE W REFLEX MICROSCOPIC
Bilirubin Urine: NEGATIVE
Glucose, UA: NEGATIVE mg/dL
Ketones, ur: NEGATIVE mg/dL
Leukocytes,Ua: NEGATIVE
Nitrite: NEGATIVE
Protein, ur: NEGATIVE mg/dL
RBC / HPF: >50 RBC/hpf — ABNORMAL HIGH (ref 0–5)
Specific Gravity, Urine: 1.025 (ref 1.005–1.030)
pH: 6 (ref 5.0–8.0)

## 2022-06-01 LAB — I-STAT BETA HCG BLOOD, ED (MC, WL, AP ONLY): I-stat hCG, quantitative: 841.3 m[IU]/mL — ABNORMAL HIGH (ref <5)

## 2022-06-01 MED ORDER — IBUPROFEN 800 MG PO TABS
800.0000 mg | ORAL_TABLET | Freq: Once | ORAL | Status: AC
  Administered 2022-06-02: 800 mg via ORAL
  Filled 2022-06-01: qty 1

## 2022-06-01 NOTE — ED Triage Notes (Signed)
Pt with right sided abdominal pain.  Hx of ectopic pregnancy in Dec and removal of fallopian tube on the left. This feels similar to that as well as similar to when she had ovarian cyst rupture. Nausea. No vomiting. Vaginal bleeding began today.

## 2022-06-01 NOTE — ED Provider Triage Note (Signed)
  Emergency Medicine Provider Triage Evaluation Note  MRN:  [id number]  Arrival date & time: 06/01/22    Medically screening exam initiated at 11:16 PM.   CC:   Abdominal Pain   HPI:  Destiny Lambert is a 31 y.o. year-old female presents to the ED with chief complaint of right adnexal pain this morning.  Denies dysuria.  Reports vaginal bleeding.  Rates pain as severe.  History provided by patient. ROS:  -As included in HPI PE:   Vitals:   06/01/22 2246  BP: 102/66  Pulse: 77  Resp: 16  Temp: 98.2 F (36.8 C)  SpO2: 100%    Non-toxic appearing No respiratory distress  MDM:  Based on signs and symptoms, ovarian torsion is highest on my differential, followed by cyst. I've ordered labs imaging in triage to expedite lab/diagnostic workup.  Patient was informed that the remainder of the evaluation will be completed by another provider, this initial triage assessment does not replace that evaluation, and the importance of remaining in the ED until their evaluation is complete.    Roxy Horseman, PA-C 06/01/22 2319

## 2022-06-02 ENCOUNTER — Observation Stay (HOSPITAL_COMMUNITY): Payer: BC Managed Care – PPO | Admitting: Anesthesiology

## 2022-06-02 ENCOUNTER — Other Ambulatory Visit: Payer: Self-pay | Admitting: Advanced Practice Midwife

## 2022-06-02 ENCOUNTER — Observation Stay (HOSPITAL_COMMUNITY): Payer: BC Managed Care – PPO

## 2022-06-02 ENCOUNTER — Encounter (HOSPITAL_COMMUNITY): Payer: Self-pay | Admitting: Obstetrics and Gynecology

## 2022-06-02 ENCOUNTER — Encounter (HOSPITAL_COMMUNITY)
Admission: EM | Disposition: A | Discharge status: Home / Self Care | Payer: Self-pay | Source: Home / Self Care | Attending: Obstetrics and Gynecology

## 2022-06-02 ENCOUNTER — Other Ambulatory Visit: Payer: Self-pay

## 2022-06-02 DIAGNOSIS — O3680X0 Pregnancy with inconclusive fetal viability, not applicable or unspecified: Secondary | ICD-10-CM | POA: Diagnosis present

## 2022-06-02 DIAGNOSIS — O009 Unspecified ectopic pregnancy without intrauterine pregnancy: Secondary | ICD-10-CM | POA: Diagnosis present

## 2022-06-02 HISTORY — PX: DILATION AND EVACUATION: SHX1459

## 2022-06-02 HISTORY — PX: LAPAROSCOPIC UNILATERAL SALPINGECTOMY: SHX5934

## 2022-06-02 LAB — CBC
HCT: 35.9 % — ABNORMAL LOW (ref 36.0–46.0)
Hemoglobin: 12.8 g/dL (ref 12.0–15.0)
MCH: 32.5 pg (ref 26.0–34.0)
MCHC: 35.7 g/dL (ref 30.0–36.0)
MCV: 91.1 fL (ref 80.0–100.0)
Platelets: 187 10*3/uL (ref 150–400)
RBC: 3.94 MIL/uL (ref 3.87–5.11)
RDW: 11.8 % (ref 11.5–15.5)
WBC: 10.8 10*3/uL — ABNORMAL HIGH (ref 4.0–10.5)
nRBC: 0 % (ref 0.0–0.2)

## 2022-06-02 LAB — COMPREHENSIVE METABOLIC PANEL
ALT: 13 U/L (ref 0–44)
AST: 18 U/L (ref 15–41)
Albumin: 3.9 g/dL (ref 3.5–5.0)
Alkaline Phosphatase: 55 U/L (ref 38–126)
Anion gap: 5 (ref 5–15)
BUN: 16 mg/dL (ref 6–20)
CO2: 26 mmol/L (ref 22–32)
Calcium: 8.9 mg/dL (ref 8.9–10.3)
Chloride: 104 mmol/L (ref 98–111)
Creatinine, Ser: 1.09 mg/dL — ABNORMAL HIGH (ref 0.44–1.00)
GFR, Estimated: >60 mL/min (ref >60)
Glucose, Bld: 111 mg/dL — ABNORMAL HIGH (ref 70–99)
Potassium: 3.7 mmol/L (ref 3.5–5.1)
Sodium: 135 mmol/L (ref 135–145)
Total Bilirubin: 0.3 mg/dL (ref 0.3–1.2)
Total Protein: 6.3 g/dL — ABNORMAL LOW (ref 6.5–8.1)

## 2022-06-02 LAB — HCG, QUANTITATIVE, PREGNANCY: hCG, Beta Chain, Quant, S: 910 m[IU]/mL — ABNORMAL HIGH (ref <5)

## 2022-06-02 LAB — LIPASE, BLOOD: Lipase: 34 U/L (ref 11–51)

## 2022-06-02 SURGERY — SALPINGECTOMY, UNILATERAL, LAPAROSCOPIC
Anesthesia: General | Laterality: Right

## 2022-06-02 MED ORDER — HYDROMORPHONE HCL 1 MG/ML IJ SOLN
1.0000 mg | INTRAMUSCULAR | Status: DC | PRN
  Administered 2022-06-02 (×4): 1 mg via INTRAVENOUS
  Filled 2022-06-02 (×4): qty 1

## 2022-06-02 MED ORDER — BUPIVACAINE HCL (PF) 0.25 % IJ SOLN
INTRAMUSCULAR | Status: AC
  Filled 2022-06-02: qty 30

## 2022-06-02 MED ORDER — PHENYLEPHRINE 80 MCG/ML (10ML) SYRINGE FOR IV PUSH (FOR BLOOD PRESSURE SUPPORT)
PREFILLED_SYRINGE | INTRAVENOUS | Status: AC
  Filled 2022-06-02: qty 10

## 2022-06-02 MED ORDER — LACTATED RINGERS IV BOLUS
1000.0000 mL | Freq: Once | INTRAVENOUS | Status: AC
  Administered 2022-06-02: 1000 mL via INTRAVENOUS

## 2022-06-02 MED ORDER — ONDANSETRON HCL 4 MG/2ML IJ SOLN
4.0000 mg | Freq: Once | INTRAMUSCULAR | Status: AC
  Administered 2022-06-02: 4 mg via INTRAVENOUS
  Filled 2022-06-02: qty 2

## 2022-06-02 MED ORDER — ONDANSETRON HCL 4 MG/2ML IJ SOLN
INTRAMUSCULAR | Status: AC
  Filled 2022-06-02: qty 2

## 2022-06-02 MED ORDER — SCOPOLAMINE 1 MG/3DAYS TD PT72: 1.0000 | MEDICATED_PATCH | Freq: Once | TRANSDERMAL | Status: DC

## 2022-06-02 MED ORDER — ALPRAZOLAM 0.5 MG PO TABS
0.5000 mg | ORAL_TABLET | ORAL | Status: DC | PRN
  Administered 2022-06-02: 0.5 mg via ORAL
  Filled 2022-06-02: qty 1

## 2022-06-02 MED ORDER — FENTANYL CITRATE PF 50 MCG/ML IJ SOSY
50.0000 ug | PREFILLED_SYRINGE | Freq: Once | INTRAMUSCULAR | Status: AC
Start: 2022-06-02 — End: 2022-06-02
  Administered 2022-06-02: 50 ug via INTRAVENOUS
  Filled 2022-06-02: qty 1

## 2022-06-02 MED ORDER — SUCCINYLCHOLINE CHLORIDE 200 MG/10ML IV SOSY
PREFILLED_SYRINGE | INTRAVENOUS | Status: AC
  Filled 2022-06-02: qty 10

## 2022-06-02 MED ORDER — ACETAMINOPHEN 10 MG/ML IV SOLN
INTRAVENOUS | Status: AC
  Filled 2022-06-02: qty 100

## 2022-06-02 MED ORDER — 0.9 % SODIUM CHLORIDE (POUR BTL) OPTIME
TOPICAL | Status: DC | PRN
  Administered 2022-06-02: 1000 mL

## 2022-06-02 MED ORDER — PHENYLEPHRINE 80 MCG/ML (10ML) SYRINGE FOR IV PUSH (FOR BLOOD PRESSURE SUPPORT)
PREFILLED_SYRINGE | INTRAVENOUS | Status: DC | PRN
  Administered 2022-06-02: 40 ug via INTRAVENOUS
  Administered 2022-06-02: 160 ug via INTRAVENOUS

## 2022-06-02 MED ORDER — FENTANYL CITRATE (PF) 250 MCG/5ML IJ SOLN
INTRAMUSCULAR | Status: DC | PRN
  Administered 2022-06-02 (×2): 50 ug via INTRAVENOUS
  Administered 2022-06-02: 100 ug via INTRAVENOUS

## 2022-06-02 MED ORDER — SCOPOLAMINE 1 MG/3DAYS TD PT72
MEDICATED_PATCH | TRANSDERMAL | Status: AC
  Filled 2022-06-02: qty 1

## 2022-06-02 MED ORDER — DEXAMETHASONE SODIUM PHOSPHATE 10 MG/ML IJ SOLN
INTRAMUSCULAR | Status: DC | PRN
  Administered 2022-06-02: 10 mg via INTRAVENOUS

## 2022-06-02 MED ORDER — ARTIFICIAL TEARS OPHTHALMIC OINT
TOPICAL_OINTMENT | OPHTHALMIC | Status: AC
  Filled 2022-06-02: qty 3.5

## 2022-06-02 MED ORDER — FENTANYL CITRATE (PF) 100 MCG/2ML IJ SOLN
INTRAMUSCULAR | Status: AC
  Filled 2022-06-02: qty 2

## 2022-06-02 MED ORDER — FENTANYL CITRATE (PF) 250 MCG/5ML IJ SOLN
INTRAMUSCULAR | Status: AC
  Filled 2022-06-02: qty 5

## 2022-06-02 MED ORDER — ONDANSETRON HCL 4 MG/2ML IJ SOLN
INTRAMUSCULAR | Status: DC | PRN
  Administered 2022-06-02: 4 mg via INTRAVENOUS

## 2022-06-02 MED ORDER — ROCURONIUM BROMIDE 10 MG/ML (PF) SYRINGE
PREFILLED_SYRINGE | INTRAVENOUS | Status: DC | PRN
  Administered 2022-06-02: 50 mg via INTRAVENOUS

## 2022-06-02 MED ORDER — PROPOFOL 10 MG/ML IV BOLUS
INTRAVENOUS | Status: DC | PRN
  Administered 2022-06-02: 150 mg via INTRAVENOUS

## 2022-06-02 MED ORDER — EPHEDRINE 5 MG/ML INJ
INTRAVENOUS | Status: AC
  Filled 2022-06-02: qty 5

## 2022-06-02 MED ORDER — ACETAMINOPHEN 500 MG PO TABS: 1000.0000 mg | ORAL_TABLET | Freq: Once | ORAL | Status: DC

## 2022-06-02 MED ORDER — SUGAMMADEX SODIUM 200 MG/2ML IV SOLN
INTRAVENOUS | Status: DC | PRN
  Administered 2022-06-02: 200 mg via INTRAVENOUS

## 2022-06-02 MED ORDER — FENTANYL CITRATE (PF) 100 MCG/2ML IJ SOLN
25.0000 ug | INTRAMUSCULAR | Status: DC | PRN
  Administered 2022-06-02 (×2): 50 ug via INTRAVENOUS

## 2022-06-02 MED ORDER — SODIUM CHLORIDE (PF) 0.9 % IJ SOLN
INTRAMUSCULAR | Status: DC | PRN
  Administered 2022-06-02: 10 mL

## 2022-06-02 MED ORDER — SCOPOLAMINE 1 MG/3DAYS TD PT72
1.0000 | MEDICATED_PATCH | TRANSDERMAL | Status: DC
  Filled 2022-06-02: qty 1

## 2022-06-02 MED ORDER — OXYCODONE HCL 5 MG PO TABS
ORAL_TABLET | ORAL | Status: AC
  Filled 2022-06-02: qty 1

## 2022-06-02 MED ORDER — ONDANSETRON HCL 4 MG/2ML IJ SOLN: 4.0000 mg | Freq: Once | INTRAMUSCULAR | Status: DC | PRN

## 2022-06-02 MED ORDER — ROCURONIUM BROMIDE 10 MG/ML (PF) SYRINGE
PREFILLED_SYRINGE | INTRAVENOUS | Status: AC
  Filled 2022-06-02: qty 10

## 2022-06-02 MED ORDER — MIDAZOLAM HCL 2 MG/2ML IJ SOLN
INTRAMUSCULAR | Status: DC | PRN
  Administered 2022-06-02: 2 mg via INTRAVENOUS

## 2022-06-02 MED ORDER — OXYCODONE-ACETAMINOPHEN 5-325 MG PO TABS: 2.0000 | ORAL_TABLET | Freq: Four times a day (QID) | ORAL | 0 refills | Status: AC | PRN

## 2022-06-02 MED ORDER — CHLORHEXIDINE GLUCONATE 0.12 % MT SOLN
OROMUCOSAL | Status: AC
  Filled 2022-06-02: qty 15

## 2022-06-02 MED ORDER — SODIUM CHLORIDE 0.9 % IR SOLN
Status: DC | PRN
  Administered 2022-06-02: 3000 mL

## 2022-06-02 MED ORDER — SUCCINYLCHOLINE CHLORIDE 200 MG/10ML IV SOSY
PREFILLED_SYRINGE | INTRAVENOUS | Status: DC | PRN
  Administered 2022-06-02: 100 mg via INTRAVENOUS

## 2022-06-02 MED ORDER — LIDOCAINE 2% (20 MG/ML) 5 ML SYRINGE
INTRAMUSCULAR | Status: AC
  Filled 2022-06-02: qty 5

## 2022-06-02 MED ORDER — SODIUM CHLORIDE 0.9 % IV SOLN
100.0000 mg | INTRAVENOUS | Status: AC
  Administered 2022-06-02: 100 mg via INTRAVENOUS
  Filled 2022-06-02: qty 100

## 2022-06-02 MED ORDER — OXYCODONE HCL 5 MG PO TABS
5.0000 mg | ORAL_TABLET | Freq: Once | ORAL | Status: AC
  Administered 2022-06-02: 5 mg via ORAL

## 2022-06-02 MED ORDER — MIDAZOLAM HCL 2 MG/2ML IJ SOLN
INTRAMUSCULAR | Status: AC
  Filled 2022-06-02: qty 2

## 2022-06-02 MED ORDER — LIDOCAINE 2% (20 MG/ML) 5 ML SYRINGE
INTRAMUSCULAR | Status: DC | PRN
  Administered 2022-06-02: 80 mg via INTRAVENOUS

## 2022-06-02 MED ORDER — LACTATED RINGERS IV SOLN: INTRAVENOUS | Status: DC

## 2022-06-02 MED ORDER — ACETAMINOPHEN 500 MG PO TABS
ORAL_TABLET | ORAL | Status: AC
  Filled 2022-06-02: qty 2

## 2022-06-02 MED ORDER — ACETAMINOPHEN 10 MG/ML IV SOLN
INTRAVENOUS | Status: DC | PRN
  Administered 2022-06-02: 1000 mg via INTRAVENOUS

## 2022-06-02 MED ORDER — DEXAMETHASONE SODIUM PHOSPHATE 10 MG/ML IJ SOLN
INTRAMUSCULAR | Status: AC
  Filled 2022-06-02: qty 1

## 2022-06-02 MED ORDER — SODIUM CHLORIDE (PF) 0.9 % IJ SOLN
INTRAMUSCULAR | Status: AC
  Filled 2022-06-02: qty 10

## 2022-06-02 MED ORDER — IBUPROFEN 800 MG PO TABS: 800.0000 mg | ORAL_TABLET | Freq: Three times a day (TID) | ORAL | 1 refills | Status: DC | PRN

## 2022-06-02 MED ORDER — BUPIVACAINE HCL (PF) 0.25 % IJ SOLN
INTRAMUSCULAR | Status: DC | PRN
  Administered 2022-06-02: 26 mL

## 2022-06-02 SURGICAL SUPPLY — 47 items
CABLE HIGH FREQUENCY MONO STRZ (ELECTRODE) IMPLANT
CATH ROBINSON RED A/P 16FR (CATHETERS) ×3 IMPLANT
DERMABOND ADVANCED (GAUZE/BANDAGES/DRESSINGS) ×1
DERMABOND ADVANCED .7 DNX12 (GAUZE/BANDAGES/DRESSINGS) IMPLANT
DRSG OPSITE POSTOP 3X4 (GAUZE/BANDAGES/DRESSINGS) ×3 IMPLANT
DURAPREP 26ML APPLICATOR (WOUND CARE) ×3 IMPLANT
ELECT REM PT RETURN 9FT ADLT (ELECTROSURGICAL) ×3
ELECTRODE REM PT RTRN 9FT ADLT (ELECTROSURGICAL) ×2 IMPLANT
FILTER UTR ASPR ASSEMBLY (MISCELLANEOUS) ×3 IMPLANT
FORCEPS CUTTING 33CM 5MM (CUTTING FORCEPS) ×1 IMPLANT
GLOVE BIO SURGEON STRL SZ 6.5 (GLOVE) ×3 IMPLANT
GLOVE BIOGEL PI IND STRL 7.0 (GLOVE) ×8 IMPLANT
GLOVE BIOGEL PI INDICATOR 7.0 (GLOVE) ×4
GOWN STRL REUS W/ TWL LRG LVL3 (GOWN DISPOSABLE) ×4 IMPLANT
GOWN STRL REUS W/TWL LRG LVL3 (GOWN DISPOSABLE) ×2
HOSE CONNECTING 18IN BERKELEY (TUBING) ×3 IMPLANT
IRRIG SUCT STRYKERFLOW 2 WTIP (MISCELLANEOUS) ×3
IRRIGATION SUCT STRKRFLW 2 WTP (MISCELLANEOUS) IMPLANT
KIT BERKELEY 1ST TRI 3/8 NO TR (MISCELLANEOUS) ×3 IMPLANT
KIT BERKELEY 1ST TRIMESTER 3/8 (MISCELLANEOUS) ×3 IMPLANT
KIT TURNOVER KIT B (KITS) ×3 IMPLANT
MANIPULATOR UTERINE 4.5 ZUMI (MISCELLANEOUS) ×3 IMPLANT
NS IRRIG 1000ML POUR BTL (IV SOLUTION) ×3 IMPLANT
PACK LAPAROSCOPY BASIN (CUSTOM PROCEDURE TRAY) ×3 IMPLANT
PACK TRENDGUARD 450 HYBRID PRO (MISCELLANEOUS) IMPLANT
PACK VAGINAL MINOR WOMEN LF (CUSTOM PROCEDURE TRAY) ×3 IMPLANT
PAD OB MATERNITY 4.3X12.25 (PERSONAL CARE ITEMS) ×3 IMPLANT
POUCH SPECIMEN RETRIEVAL 10MM (ENDOMECHANICALS) ×1 IMPLANT
PROTECTOR NERVE ULNAR (MISCELLANEOUS) ×6 IMPLANT
SET BERKELEY SUCTION TUBING (SUCTIONS) ×3 IMPLANT
SET TUBE SMOKE EVAC HIGH FLOW (TUBING) ×3 IMPLANT
SLEEVE ENDOPATH XCEL 5M (ENDOMECHANICALS) ×3 IMPLANT
SOLUTION ELECTROLUBE (MISCELLANEOUS) IMPLANT
SPIKE FLUID TRANSFER (MISCELLANEOUS) ×3 IMPLANT
SUT VICRYL 0 UR6 27IN ABS (SUTURE) ×3 IMPLANT
SUT VICRYL 4-0 PS2 18IN ABS (SUTURE) ×3 IMPLANT
SYR 5ML LL (SYRINGE) ×3 IMPLANT
TOWEL GREEN STERILE FF (TOWEL DISPOSABLE) ×6 IMPLANT
TRAY FOLEY W/BAG SLVR 14FR (SET/KITS/TRAYS/PACK) ×3 IMPLANT
TRENDGUARD 450 HYBRID PRO PACK (MISCELLANEOUS) ×3
TROCAR 11X100 Z THREAD (TROCAR) ×1 IMPLANT
TROCAR BALLN 12MMX100 BLUNT (TROCAR) ×3 IMPLANT
TROCAR XCEL NON-BLD 5MMX100MML (ENDOMECHANICALS) ×3 IMPLANT
UNDERPAD 30X36 HEAVY ABSORB (UNDERPADS AND DIAPERS) ×3 IMPLANT
VACURETTE 14MM CVD 1/2 BASE (CANNULA) ×3 IMPLANT
VACURETTE 7MM CVD STRL WRAP (CANNULA) ×1 IMPLANT
WARMER LAPAROSCOPE (MISCELLANEOUS) ×3 IMPLANT

## 2022-06-02 NOTE — H&P (Addendum)
Destiny Lambert is an 31 y.o. female. G2 P0 presented  to ER this am with abdominal pain.Reports nl menses 2w ago.No BC. Only med is spironolactone. Pain and bleeding starting yesterday. Quant 841K.. Hgb 13.1 last night and 12.8 on rpt this am.  Pertinent Gynecological History: Menses: flow is light Bleeding: intermenstrual bleeding Contraception: none DES exposure: denies Blood transfusions: none Sexually transmitted diseases: no past history Previous GYN Procedures:  LS for ectopic for 12/22 with Left salpingectomy   Last mammogram:  na  Date: na Last pap: normal Date: 2022 OB History: G2, P0   Menstrual History: Menarche age: 44 No LMP recorded. Patient is pregnant.    Past Medical History:  Diagnosis Date   Anemia    Headache     Past Surgical History:  Procedure Laterality Date   DIAGNOSTIC LAPAROSCOPY WITH REMOVAL OF ECTOPIC PREGNANCY N/A 09/29/2021   Procedure: DIAGNOSTIC LAPAROSCOPY WITH REMOVAL OF ECTOPIC PREGNANCY;  Surgeon: Toy Baker, DO;  Location: [facility] OR;  Service: Gynecology;  Laterality: N/A;   LAPAROSCOPIC UNILATERAL SALPINGECTOMY Left 09/29/2021   Procedure: LAPAROSCOPIC UNILATERAL SALPINGECTOMY;  Surgeon: Toy Baker, DO;  Location: [facility] OR;  Service: Gynecology;  Laterality: Left;   MOUTH SURGERY     TOOTH EXTRACTION      Family History  Problem Relation Age of Onset   Alcohol abuse Neg Hx    Arthritis Neg Hx    Asthma Neg Hx    Birth defects Neg Hx    Cancer Neg Hx    COPD Neg Hx    Depression Neg Hx    Diabetes Neg Hx    Drug abuse Neg Hx    Early death Neg Hx    Hearing loss Neg Hx    Heart disease Neg Hx    Hyperlipidemia Neg Hx    Hypertension Neg Hx    Kidney disease Neg Hx    Learning disabilities Neg Hx    Mental illness Neg Hx    Mental retardation Neg Hx    Miscarriages / Stillbirths Neg Hx    Stroke Neg Hx    Vision loss Neg Hx    Varicose Veins Neg Hx     Social History:  reports that she has never smoked. She has never  used smokeless tobacco. She reports current alcohol use. She reports that she does not use drugs.  Allergies: No Known Allergies  Medications Prior to Admission  Medication Sig Dispense Refill Last Dose   Prenatal Vit-Fe Fumarate-FA (PRENATAL MULTIVITAMIN) TABS tablet Take 1 tablet by mouth daily at 12 noon.      SUMAtriptan (IMITREX) 100 MG tablet Take 100 mg by mouth every 2 (two) hours as needed for migraine. May repeat in 2 hours if headache persists or recurs.       Review of Systems  Genitourinary:  Positive for pelvic pain and vaginal bleeding.  All other systems reviewed and are negative.   Blood pressure 97/61, pulse 87, temperature 97.8 F (36.6 C), temperature source Oral, resp. rate 18, SpO2 100 %. Physical Exam Constitutional:      Appearance: She is well-developed and normal weight.  HENT:     Head: Normocephalic and atraumatic.  Cardiovascular:     Rate and Rhythm: Normal rate and regular rhythm.     Heart sounds: Normal heart sounds.  Pulmonary:     Effort: Pulmonary effort is normal.     Breath sounds: Normal breath sounds.  Abdominal:     General: Abdomen is flat  and scaphoid. A surgical scar is present. Bowel sounds are normal.     Palpations: Abdomen is soft.  Skin:    General: Skin is warm and dry.  Neurological:     General: No focal deficit present.     Mental Status: She is alert and oriented to person, place, and time.  Psychiatric:        Mood and Affect: Mood normal.        Behavior: Behavior normal.    No rebound or guarding on abdominal exam. Non surgical abdomen. Results for orders placed or performed during the hospital encounter of 06/01/22 (from the past 24 hour(s))  Urinalysis, Routine w reflex microscopic Urine, Clean Catch     Status: Abnormal   Collection Time: 06/01/22 11:12 PM  Result Value Ref Range   Color, Urine YELLOW YELLOW   APPearance HAZY (A) CLEAR   Specific Gravity, Urine 1.025 1.005 - 1.030   pH 6.0 5.0 - 8.0    Glucose, UA NEGATIVE NEGATIVE mg/dL   Hgb urine dipstick MODERATE (A) NEGATIVE   Bilirubin Urine NEGATIVE NEGATIVE   Ketones, ur NEGATIVE NEGATIVE mg/dL   Protein, ur NEGATIVE NEGATIVE mg/dL   Nitrite NEGATIVE NEGATIVE   Leukocytes,Ua NEGATIVE NEGATIVE   RBC / HPF >50 (H) 0 - 5 RBC/hpf   WBC, UA 0-5 0 - 5 WBC/hpf   Bacteria, UA RARE (A) NONE SEEN   Squamous Epithelial / LPF 0-5 0 - 5   Mucus PRESENT    Amorphous Crystal PRESENT   Comprehensive metabolic panel     Status: Abnormal   Collection Time: 06/01/22 11:17 PM  Result Value Ref Range   Sodium 135 135 - 145 mmol/L   Potassium 3.7 3.5 - 5.1 mmol/L   Chloride 104 98 - 111 mmol/L   CO2 26 22 - 32 mmol/L   Glucose, Bld 111 (H) 70 - 99 mg/dL   BUN 16 6 - 20 mg/dL   Creatinine, Ser 1.01 (H) 0.44 - 1.00 mg/dL   Calcium 8.9 8.9 - 75.1 mg/dL   Total Protein 6.3 (L) 6.5 - 8.1 g/dL   Albumin 3.9 3.5 - 5.0 g/dL   AST 18 15 - 41 U/L   ALT 13 0 - 44 U/L   Alkaline Phosphatase 55 38 - 126 U/L   Total Bilirubin 0.3 0.3 - 1.2 mg/dL   GFR, Estimated >02 >58 mL/min   Anion gap 5 5 - 15  Lipase, blood     Status: None   Collection Time: 06/01/22 11:17 PM  Result Value Ref Range   Lipase 34 11 - 51 U/L  CBC with Diff     Status: Abnormal   Collection Time: 06/01/22 11:17 PM  Result Value Ref Range   WBC 11.8 (H) 4.0 - 10.5 K/uL   RBC 4.09 3.87 - 5.11 MIL/uL   Hemoglobin 13.1 12.0 - 15.0 g/dL   HCT 52.7 78.2 - 42.3 %   MCV 93.2 80.0 - 100.0 fL   MCH 32.0 26.0 - 34.0 pg   MCHC 34.4 30.0 - 36.0 g/dL   RDW 53.6 14.4 - 31.5 %   Platelets 217 150 - 400 K/uL   nRBC 0.0 0.0 - 0.2 %   Neutrophils Relative % 75 %   Neutro Abs 8.8 (H) 1.7 - 7.7 K/uL   Lymphocytes Relative 18 %   Lymphs Abs 2.1 0.7 - 4.0 K/uL   Monocytes Relative 7 %   Monocytes Absolute 0.9 0.1 - 1.0 K/uL   Eosinophils  Relative 0 %   Eosinophils Absolute 0.0 0.0 - 0.5 K/uL   Basophils Relative 0 %   Basophils Absolute 0.0 0.0 - 0.1 K/uL   Immature Granulocytes 0 %    Abs Immature Granulocytes 0.02 0.00 - 0.07 K/uL  I-Stat beta hCG blood, ED     Status: Abnormal   Collection Time: 06/01/22 11:32 PM  Result Value Ref Range   I-stat hCG, quantitative 841.3 (H) <5 mIU/mL   Comment 3          CBC     Status: Abnormal   Collection Time: 06/02/22  9:56 AM  Result Value Ref Range   WBC 10.8 (H) 4.0 - 10.5 K/uL   RBC 3.94 3.87 - 5.11 MIL/uL   Hemoglobin 12.8 12.0 - 15.0 g/dL   HCT 71.0 (L) 62.6 - 94.8 %   MCV 91.1 80.0 - 100.0 fL   MCH 32.5 26.0 - 34.0 pg   MCHC 35.7 30.0 - 36.0 g/dL   RDW 54.6 27.0 - 35.0 %   Platelets 187 150 - 400 K/uL   nRBC 0.0 0.0 - 0.2 %    [place] Art/Ven Flow Abd Pelv Doppler  Result Date: 06/02/2022 CLINICAL DATA:  Vaginal bleeding and right adnexal pain. Quantitative beta HCG is 841. LMP was 2 weeks ago. EXAM: OBSTETRIC <14 WK [place] AND TRANSVAGINAL OB US DOPPLER ULTRASOUND OF OVARIES TECHNIQUE: Both transabdominal and transvaginal ultrasound examinations were performed for complete evaluation of the gestation as well as the maternal uterus, adnexal regions, and pelvic cul-de-sac. Transvaginal technique was performed to assess early pregnancy. Color and duplex Doppler ultrasound was utilized to evaluate blood flow to the ovaries. COMPARISON:  Ultrasound pelvis and CT abdomen and pelvis 12/14/2017 FINDINGS: Intrauterine gestational sac: There appears to be a single intrauterine gestational sac. Yolk sac:  Yolk sac is not visualized. Embryo:  A fetal pole is suggested. Cardiac Activity: Fetal cardiac activity is not identified. CRL:   9.7 mm   7 w 0  d Subchorionic hemorrhage:  None visualized. Maternal uterus/adnexae: The uterus is retroverted. No myometrial mass lesions are identified. Both ovaries are visualized and appear normal. Flow is demonstrated in both ovaries on color flow Doppler imaging. In the right adnexa between the right ovary and the uterus, there is a heterogeneous ovoid mixed echotexture collection measuring 1.9 x 5 cm. Mild  peripheral flow is demonstrated in the area on color flow Doppler imaging. There is a moderate amount of free fluid in the pelvis with heterogeneous appearance suggesting hemorrhagic free fluid. Pulsed Doppler evaluation of both ovaries demonstrates normal appearing low-resistance arterial and venous waveforms. IMPRESSION: 1. Apparent intrauterine gestational sac with a fetal pole. No yolk sac or fetal cardiac activity identified. Crown-rump length measures 9.7 mm. This meets criteria for definitely nonviable embryo consistent with failed pregnancy. 2. Abnormal heterogeneous collection between the right ovary and the uterus measuring 1.9 x 5 cm. Moderate hemorrhagic free fluid in the pelvis. Appearances are suspicious for ruptured ectopic pregnancy. 3. Both ovaries are separately visualized and are normal in appearance. Flow is demonstrated in both ovaries without evidence of ovarian torsion. Critical Value/emergent results were called by telephone at the time of interpretation on 06/02/2022 at 12:55 am to provider Dr. Bebe Shaggy, who verbally acknowledged these results. Electronically Signed   By: Burman Nieves M.D.   On: 06/02/2022 01:07   US OB LESS THAN 14 WEEKS WITH OB TRANSVAGINAL  Result Date: 06/02/2022 CLINICAL DATA:  Vaginal bleeding and right adnexal pain. Quantitative beta  HCG is 841. LMP was 2 weeks ago. EXAM: OBSTETRIC <14 WK [place] AND TRANSVAGINAL OB US DOPPLER ULTRASOUND OF OVARIES TECHNIQUE: Both transabdominal and transvaginal ultrasound examinations were performed for complete evaluation of the gestation as well as the maternal uterus, adnexal regions, and pelvic cul-de-sac. Transvaginal technique was performed to assess early pregnancy. Color and duplex Doppler ultrasound was utilized to evaluate blood flow to the ovaries. COMPARISON:  Ultrasound pelvis and CT abdomen and pelvis 12/14/2017 FINDINGS: Intrauterine gestational sac: There appears to be a single intrauterine gestational sac. Yolk sac:   Yolk sac is not visualized. Embryo:  A fetal pole is suggested. Cardiac Activity: Fetal cardiac activity is not identified. CRL:   9.7 mm   7 w 0  d Subchorionic hemorrhage:  None visualized. Maternal uterus/adnexae: The uterus is retroverted. No myometrial mass lesions are identified. Both ovaries are visualized and appear normal. Flow is demonstrated in both ovaries on color flow Doppler imaging. In the right adnexa between the right ovary and the uterus, there is a heterogeneous ovoid mixed echotexture collection measuring 1.9 x 5 cm. Mild peripheral flow is demonstrated in the area on color flow Doppler imaging. There is a moderate amount of free fluid in the pelvis with heterogeneous appearance suggesting hemorrhagic free fluid. Pulsed Doppler evaluation of both ovaries demonstrates normal appearing low-resistance arterial and venous waveforms. IMPRESSION: 1. Apparent intrauterine gestational sac with a fetal pole. No yolk sac or fetal cardiac activity identified. Crown-rump length measures 9.7 mm. This meets criteria for definitely nonviable embryo consistent with failed pregnancy. 2. Abnormal heterogeneous collection between the right ovary and the uterus measuring 1.9 x 5 cm. Moderate hemorrhagic free fluid in the pelvis. Appearances are suspicious for ruptured ectopic pregnancy. 3. Both ovaries are separately visualized and are normal in appearance. Flow is demonstrated in both ovaries without evidence of ovarian torsion. Critical Value/emergent results were called by telephone at the time of interpretation on 06/02/2022 at 12:55 am to provider Dr. Bebe Shaggy, who verbally acknowledged these results. Electronically Signed   By: Burman Nieves M.D.   On: 06/02/2022 01:07    Assessment/Plan: Pelvic Pain- Sono results as noted. Discussed with radiology. History of left salpingectomy with nl right adnexa reported 12/22. Quant 841 with rpt pending. 7w non viable IUP. Tubular mass in right adnexa with free fluid  , no clot seen. ? Hematosalpinx per radiology. Stable CBC.  Will ck rpt quant Serial cbc and pain management Consider fu sono Possible need for surgery discussed with patient and significant other.   60 minutes total time including face to face, review of records and consultation.   Kyian Obst J 06/02/2022, 10:36 AM

## 2022-06-02 NOTE — Progress Notes (Signed)
Met with patient this afternoon to discuss care to date and plan moving forward  Subjective: Patient admits continued pelvic pain once the Dilaudid runs out.  She has been getting 1 mg IV Dilaudid every 3 to 4 hours.  Patient noted feeling hungry at noon today when she was allowed to eat.  She ate some chicken noodle soup, vegetables and fruit and about 30 minutes later had emesis.  She is not currently feeling any nausea and has had no further emesis but has been n.p.o. since noon.  Patient admits anxiety over the unclear diagnosis and while hopeful to salvage her only remaining tube on the right patient is no longer interested in expectant management.  Patient and husband  discussed ' being okay if we only have the 1 child.'  Patient does note small amount of vaginal bleeding.  This was an unplanned but desired pregnancy.  O:  Vitals:   06/02/22 1132 06/02/22 1527  BP: (!) 96/56 (!) 96/50  Pulse: 73 78  Resp: 18 15  Temp:  97.8 F (36.6 C)  SpO2: 100% 99%   General: No acute distress and laying in bed Abdomen: Soft, tender to deep palpation, no rebound or guarding GU: Deferred Skin: Warm and dry Lower extremity: Nontender, no edema      Latest Ref Rng & Units 06/02/2022    9:56 AM 06/01/2022   11:17 PM 09/29/2021   10:10 AM  CBC  WBC 4.0 - 10.5 K/uL 10.8  11.8  10.3   Hemoglobin 12.0 - 15.0 g/dL 12.8  13.1  15.1   Hematocrit 36.0 - 46.0 % 35.9  38.1  43.9   Platelets 150 - 400 K/uL 187  217  254    Beta-hCG: 06/01/2022: 841; 06/02/2022: 910  Pelvic ultrasound: 06/02/2022: A fetal pole is suggested, crown-rump length 7 weeks 0 days, fetal cardiac activity is not identified, yolk sac is not visualized, there appears to be a single intrauterine gestational sac; retroverted uterus; the right adnexa between the right ovary and uterus there is a heterogeneous ovoid mixed echotexture collection measuring 1.9 x 5 cm.  Mild peripheral flow.  Moderate amount of free fluid in the pelvis with  heterogeneous appearance suggesting hemorrhagic free fluid.  Impression lines nonviable embryo consistent with failed intrauterine pregnancy; moderate hemorrhagic free fluid in the pelvis suspicious for ruptured ectopic pregnancy.  Previous OB provider, Dr. Ronita Hipps, discussed with me his conversation with Dr. Shon Hale of radiology who told Dr. Ronita Hipps that she felt this was a failed intrauterine pregnancy with poorly defined yolk sac and a 5 cm hematosalpinx with no evidence of ectopic and no hemoperitoneum.  This conversation has not been documented and radiology has not addended the ultrasound report.  My personal interpretation of this ultrasound: Intrauterine fluid and blood clot but no evidence of a fetal pole, no yolk sac, no decidual reaction consistent with intrauterine pregnancy.  Poorly defined right adnexa as ultrasound images are limited but most consistent with hemoperitoneum or hematosalpinx.  No clearly defined ectopic pregnancy has no classic "ring of fire" but increased irregular blood flow to this right adnexal collection  Assessment and plan 31 year old G3 P1-0-1-1 with unplanned but desired pregnancy and suspected right ectopic pregnancy in the setting of previous left ectopic pregnancy and left salpingectomy in December 2022.  My partner has spent 60 minutes with the patient earlier this morning and I have spent another 90 minutes caring for the patient this afternoon.  I have discussed with patient the unclear diagnosis  and that while the radiologist thinks this is a failed IUP I am concerned for right ectopic pregnancy.  I discussed with the patient my reasonings for concerns of ectopic pregnancy including a prior ectopic pregnancy, hemoperitoneum or hematosalpinx, beta hCG which has inappropriately risen from yesterday, patient's pain requiring Dilaudid every 3-4 hours, and the lack of decidual reaction in the uterus argues against a failed intrauterine pregnancy.  I have spent much  time discussing with the patient and her husband and her support team her options.  1 is to continue with expectant management and follow serial hemoglobin and beta hCG.  Failed intrauterine pregnancy and failing ectopic pregnancies including tubal abortions have the ability to decrease their beta-hCG and risk to the patient over time.  During any time of expectant management there is a possibility of increased pain, hemoperitoneum and complications of hemorrhage with ruptured ectopic pregnancy. 2.  Option to proceed with D&C alone and to follow beta-hCG levels tomorrow and or pathology results.  Should chorionic villi be seen in pathology we can be sure this is a failed intrauterine pregnancy and patient would avoid a laparoscopy and risks associated with ectopic pregnancy.  While heterotopic pregnancy is possible this is extremely unlikely.  Patient aware that with low levels beta-hCG we may not seen chorionic villi even if they are present.  Patient is aware should chorionic villi not be seen in beta-hCG not dropped she would then be diagnosed with an ectopic pregnancy and a second surgery would be recommended.  Given the risks associated with general anesthesia in close succession,  for D&C alone I would recommend proceeding with IV pain medicines and paracervical block and would recommend against general anesthesia.  Third option would be to proceed with D&C in the operating room and followed up with a diagnostic laparoscopy.  I discussed with patient I would evacuate any hemoperitoneum and assess for any sign of continued bleeding.  If bleeding looks like it was coming from a ovarian cyst we will control the bleeding and no further tubal surgery.  However if it appeared to be a dilated fallopian tube with hemotosalpynx, the most likely etiology would be an ectopic pregnancy.  Patient consents to salpingectomy in this situation.  Patient understands this wound cause infertility and she would need in vitro  fertilization in the future.  Patient is currently unaware of her coverage for IVF.  Patient did ask permission to continue to have menses and we talked about an IUD to prevent menses in the situation.  Discussed with patient that would be placed 2 weeks postoperatively.  I did discuss with patient the options for salpingostomy with attempted removal of ectopic pregnancy tissue.  I discussed with patient she is at risk for subsequent ectopic pregnancy is given this damage to be further data managed by salpingostomy.  I also discussed with the patient should continue a salpingostomy there is no guarantee to remove all of the ectopic pregnancy and she will need to have continued close follow-ups of beta-hCG's and possible methotrexate and possible further surgery.  Patient states she is not interested in keeping the tube if it appears to be hematosalpinx, ectopic pregnancy or damage tubes.  Patient understands that I am acting in her best interest with goals of keeping her healthy and preserving fertility but that at the time of surgery I will not have the benefit of knowing the pathologic findings.  My treatment plan will be based on the information I have at the time of surgery.  Patient understands there was a possibility that I proceed with a salpingectomy and the final pathology shows a failed intrauterine pregnancy and no evidence of an ectopic pregnancy.  Patient understands in this setting we have ended her fertility and had we proceeded in a more stepwise approach and favored expectant management we may have ended up with a different outcome that allowed her to continue her fertility.  Patient again stated she did not want to "wait and see" and desired D&C with laparoscopy tonight.  Plan made for surgical intervention 8 hours from her last meal.  Patient asked for "something for anxiety".  We will give Xanax prior to surgery.  We will continue Dilaudid for pain control surgery.  Ala Dach 06/02/2022  4:46 PM

## 2022-06-02 NOTE — Progress Notes (Signed)
Pacu Discharge Note  Patient instuctions were given to family. Wound care, diet, pain, follow up care and how and whom to contact with concerns were discussed. Family aware someone needs to remain with patient overnight and concerns after receiving anesthesia and what to avoid and safety. Answered all questions and concerns.   Discharge paperwork has clear contact informations for surgeon and 24 hour RN line for concerns.   Discussed what concerns to look for including infection and signs/symptoms to look for. Discussed patient has 3 lapsites, one has a honeycomb dressing and skin glue, the other 2 only have skin glue. Discussed okay to shower in  24 hours, ok to get soap on incisions, do not scrub them, have someone present in home in case pt feels faint, let skin glue fall off on its own in 7-10 days  IV was removed prior to discharge. Patient was brought to car with belongings.   Pt exits my care.

## 2022-06-02 NOTE — Anesthesia Postprocedure Evaluation (Signed)
Anesthesia Post Note  Patient: Destiny Lambert  Procedure(s) Performed: LAPAROSCOPIC RIGHT SALPINGECTOMY WITH REMOVAL OF ECTOPIC PREGNANCY, REMOVAL OF HEMOPERITONEUM (Right) ULTRASOUND GUIDED DILATATION AND CURETTINGS     Patient location during evaluation: PACU Anesthesia Type: General Level of consciousness: awake and alert Pain management: pain level controlled Vital Signs Assessment: post-procedure vital signs reviewed and stable Respiratory status: spontaneous breathing, nonlabored ventilation, respiratory function stable and patient connected to nasal cannula oxygen Cardiovascular status: blood pressure returned to baseline and stable Postop Assessment: no apparent nausea or vomiting Anesthetic complications: no   No notable events documented.  Last Vitals:  Vitals:   06/02/22 2245 06/02/22 2250  BP: 111/74   Pulse: 80 100  Resp: 11 12  Temp:  36.7 C  SpO2: 99% 99%    Last Pain:  Vitals:   06/02/22 2230  TempSrc:   PainSc: 6                  Collene Schlichter

## 2022-06-02 NOTE — Brief Op Note (Signed)
06/02/2022  10:04 PM  PATIENT:  Destiny Lambert  31 y.o. female  PRE-OPERATIVE DIAGNOSIS: Abnormal pregnancy of unclear location but suspected right ECTOPIC PREGNANCY without intrauterine pregnancy  POST-OPERATIVE DIAGNOSIS:  ECTOPIC PREGNANCY, hemoperitoneum  PROCEDURE:  Procedure(s): LAPAROSCOPIC RIGHT SALPINGECTOMY WITH REMOVAL OF ECTOPIC PREGNANCY, REMOVAL OF HEMOPERITONEUM (Right) ULTRASOUND GUIDED DILATATION AND CURETTINGS (N/A)  SURGEON:  Surgeon(s) and Role:    * Noland Fordyce, MD - Primary  PHYSICIAN ASSISTANT: None  ASSISTANTS: Nursing staff  ANESTHESIA:   local and general  EBL:  100 mL   BLOOD ADMINISTERED:none  DRAINS: none   LOCAL MEDICATIONS USED:  MARCAINE     SPECIMEN: Endometrial curettings, right fallopian tube with suspected ectopic pregnancy  DISPOSITION OF SPECIMEN:  PATHOLOGY  COUNTS:  YES  TOURNIQUET:  * No tourniquets in log *  DICTATION: .Note written in EPIC  PLAN OF CARE: Discharge to home after PACU  PATIENT DISPOSITION:  PACU - hemodynamically stable.   Delay start of Pharmacological VTE agent (>24hrs) due to surgical blood loss or risk of bleeding: yes

## 2022-06-02 NOTE — Progress Notes (Signed)
Orders placed per phone call with Dr. Billy Coast, patient to be placed in OBs on [facility]. Dr. Billy Coast plans to round of patient, likely repeat blood work and ultrasound later this morning.  Clayton Bibles, MSA, MSN, CNM Certified Nurse Midwife, Biochemist, clinical for [employer], [facility]

## 2022-06-02 NOTE — Transfer of Care (Signed)
Immediate Anesthesia Transfer of Care Note  Patient: Destiny Lambert  Procedure(s) Performed: LAPAROSCOPIC RIGHT SALPINGECTOMY WITH REMOVAL OF ECTOPIC PREGNANCY, REMOVAL OF HEMOPERITONEUM (Right) ULTRASOUND GUIDED DILATATION AND CURETTINGS  Patient Location: PACU  Anesthesia Type:General  Level of Consciousness: drowsy  Airway & Oxygen Therapy: Patient Spontanous Breathing and Patient connected to face mask oxygen  Post-op Assessment: Report given to RN and Post -op Vital signs reviewed and stable  Post vital signs: Reviewed and stable  Last Vitals:  Vitals Value Taken Time  BP    Temp    Pulse 80 06/02/22 2139  Resp 11 06/02/22 2139  SpO2 100 % 06/02/22 2139  Vitals shown include unvalidated device data.  Last Pain:  Vitals:   06/02/22 1724  TempSrc:   PainSc: 0-No pain         Complications: No notable events documented.

## 2022-06-02 NOTE — Op Note (Signed)
06/02/2022  10:04 PM  PATIENT:  Destiny Lambert  31 y.o. female  PRE-OPERATIVE DIAGNOSIS: Abnormal pregnancy of unclear location but suspected right ECTOPIC PREGNANCY without intrauterine pregnancy  POST-OPERATIVE DIAGNOSIS:  ECTOPIC PREGNANCY, hemoperitoneum  PROCEDURE:  Procedure(s): LAPAROSCOPIC RIGHT SALPINGECTOMY WITH REMOVAL OF ECTOPIC PREGNANCY, REMOVAL OF HEMOPERITONEUM (Right) ULTRASOUND GUIDED DILATATION AND CURETTINGS (N/A)  SURGEON:  Surgeon(s) and Role:    * Noland Fordyce, MD - Primary  PHYSICIAN ASSISTANT: None  ASSISTANTS: Nursing staff  ANESTHESIA:   local and general  EBL:  100 mL   BLOOD ADMINISTERED:none  DRAINS: none   LOCAL MEDICATIONS USED:  MARCAINE     SPECIMEN: Endometrial curettings, right fallopian tube with suspected ectopic pregnancy  DISPOSITION OF SPECIMEN:  PATHOLOGY  COUNTS:  YES  TOURNIQUET:  * No tourniquets in log *  DICTATION: .Note written in EPIC  PLAN OF CARE: Discharge to home after PACU  PATIENT DISPOSITION:  PACU - hemodynamically stable.   Delay start of Pharmacological VTE agent (>24hrs) due to surgical blood loss or risk of bleeding: yes   Estimated blood loss: Minimal Complications: None Antibiotics: 100 mg IV doxycycline  Findings: Preprocedure ultrasound with normal-appearing uterus with thin endometrial stripe and no evidence of intrauterine pregnancy or gestational sac.  Preprocedure ultrasound also with right adnexal complex free fluid.  D&C with small blood and no tissue.,  Thin endometrial stripe both pre and post procedure.  Laparoscopically moderate hemoperitoneum.  Normal bilateral ovaries, normal liver edge, no evidence of endometriosis, normal uterus, absent left fallopian tube, right fallopian tube edematous and dilated with blood and clot at the fimbriated end.  Hemostasis post procedure.  Indications: This is a 31 year old G3 P1-0-1-1 admitted last night with acute pelvic pain in the setting of unplanned  but desired pregnancy found to have ultrasound consistent with hemoperitoneum and possible right ectopic pregnancy but also a question of whether there was also a intrauterine pregnancy.  After complete review of her history and exam and ultrasound imaging I reviewed with the patient my clinical suspicion for ruptured right ectopic pregnancy without an intrauterine pregnancy and surgical management was decided upon.  Patient is aware that removal of a right ectopic pregnancy via right salpingectomy would lead to infertility.  Patient and her husband expressed understanding and agreed to the plan.  Procedure: After discussion of the diagnosis and discussing with the patient options of immediate operative intervention versus close observation with serial exams and CBC in the hospital and reviewing risks and benefits of both options, decision was made to proceed with operative intervention.   A preprocedure operative ultrasound was performed transvaginally.  Patient was then prepped and draped in the normal sterile fashion in dorsal supine lithotomy position. A bimanual examination was done to assess the size and position of the uterus. A sterile speculum was placed in the vagina and a single-tooth tenaculum used to grasp the anterior lip of the cervix. The cervix was serially dilated to a #23 Shawnie Pons. A 7 French suction curet was inserted into the cervix, just past the internal os with ultrasound guidance. This was placed on suction and almost no return of blood or tissue.  There was less than 3 cc of blood in the tubing.  The endometrial stripe appeared thin without flow.  A sharp curet was then used to gently palpate the walls of the uterus.  A gritty cry was noted.  With traction on the tenaculum that was placed at 12:00 at the cervix, a ZUMI was attempted to be  placed however proper seating of the intrauterine balloon was unable to occur and the decision was made to use a Hulka tenaculum.  This was placed  without problem and the speculum was then removed.  Gloves were changed attention was turned to the patient's abdomen. 10 cc of half percent Marcaine were used in the infraumbilical fold and in the left and right lower quadrants. A 5 mm skin incision was made in the infraumbilical fold and an Optiview direct insertion was used to gain peritoneal access.  The peritoneum was then insufflated with CO2 gas to 15 mm of mercury. The pelvis was fully evaluated with findings as above. Right and left lower quadrant sites were marked, local anesthetic was infused, and 5 mm skin incisions were made in the right left lower cord. Non-bladed 5 mm trochars were then inserted under direct visualization.  The umbilical trocar was changed to a 10 mm trocar.  Suction irrigation was used to remove the moderate hemoperitoneum.  The right fallopian tube was found to be suspicious for ectopic pregnancy with dilation and edema in the distal half of the fallopian tube.  Grasping instruments were placed through the lower ports to aid in visualization of the patient's abdomen and pelvis. The decision was then made to proceed with right salpingectomy. The gyrus device was used to serially transect and cauterize the tube, starting at the fimbriated end and with staying clear of the ovary and infundibulopelvic ligament.. Serial cautery and transection were done along the mesosalpinx for several centimeters until we reached the cornual.  The tube was then transected.  Once free and hemostasis was assured, I switched to a 5 mm camera placed a Endo Catch bag through the umbilicus port. The removed tube with ectopic pregnancy was placed in the Endo Catch bag and removed through the umbilical port. The pelvis was then copiously irrigated with warmed normal saline until all blood and clot was removed. The pedicle was reinspected and found to be hemostatic.  10 cc of half percent Marcaine were placed over the cut edge.  The trochars were removed  under direct visualization. Pneumoperitoneum was released. The skin incisions were closed with 4 -0 Vicryl and Dermabond was placed on top of them after ensuring hemostasis.  The Hulka tenaculum was removed. The vagina was hemostatic.  Sponge lap and needle counts were correct x3. Patient was woken from general anesthesia having tolerated the procedure well.

## 2022-06-02 NOTE — Anesthesia Procedure Notes (Signed)
Procedure Name: Intubation Date/Time: 06/02/2022 8:13 PM  Performed by: Claudina Lick, CRNAPre-anesthesia Checklist: Patient identified, Emergency Drugs available, Suction available and Patient being monitored Patient Re-evaluated:Patient Re-evaluated prior to induction Oxygen Delivery Method: Circle system utilized Preoxygenation: Pre-oxygenation with 100% oxygen Induction Type: IV induction, Rapid sequence and Cricoid Pressure applied Laryngoscope Size: Miller and 2 Grade View: Grade I Tube type: Oral Tube size: 7.0 mm Number of attempts: 1 Airway Equipment and Method: Stylet Placement Confirmation: ETT inserted through vocal cords under direct vision, positive ETCO2 and breath sounds checked- equal and bilateral Secured at: 22 cm Tube secured with: Tape Dental Injury: Teeth and Oropharynx as per pre-operative assessment

## 2022-06-02 NOTE — Anesthesia Preprocedure Evaluation (Addendum)
Anesthesia Evaluation  Patient identified by MRN, date of birth, ID band Patient awake    Reviewed: Allergy & Precautions, NPO status , Patient's Chart, lab work & pertinent test results  Airway Mallampati: I  TM Distance: >3 FB Neck ROM: Full    Dental  (+) Teeth Intact, Dental Advisory Given   Pulmonary neg pulmonary ROS,    Pulmonary exam normal breath sounds clear to auscultation       Cardiovascular negative cardio ROS Normal cardiovascular exam Rhythm:Regular Rate:Normal     Neuro/Psych  Headaches,    GI/Hepatic negative GI ROS, Neg liver ROS,   Endo/Other  negative endocrine ROS  Renal/GU negative Renal ROS     Musculoskeletal negative musculoskeletal ROS (+)   Abdominal   Peds  Hematology  (+) Blood dyscrasia, anemia ,   Anesthesia Other Findings Day of surgery medications reviewed with the patient.  Reproductive/Obstetrics (+) Pregnancy ECTOPIC PREGNANCY                            Anesthesia Physical Anesthesia Plan  ASA: 2  Anesthesia Plan: General   Post-op Pain Management: Tylenol PO (pre-op)* and Toradol IV (intra-op)*   Induction: Intravenous  PONV Risk Score and Plan: 4 or greater and Scopolamine patch - Pre-op, Midazolam, Dexamethasone and Ondansetron  Airway Management Planned: Oral ETT  Additional Equipment:   Intra-op Plan:   Post-operative Plan: Extubation in OR  Informed Consent: I have reviewed the patients History and Physical, chart, labs and discussed the procedure including the risks, benefits and alternatives for the proposed anesthesia with the patient or authorized representative who has indicated his/her understanding and acceptance.     Dental advisory given  Plan Discussed with: CRNA  Anesthesia Plan Comments:         Anesthesia Quick Evaluation

## 2022-06-02 NOTE — ED Provider Notes (Signed)
[facility]EM[facility]GENCY DEPT [facility] Emergency Department Provider Note MRN:  [id number]  Arrival date & time: 06/02/22     Chief Complaint   Abdominal Pain   History of Present Illness   Destiny Lambert is a 31 y.o. year-old female presents to the ED with chief complaint of right lower quadrant abdominal pain that began this morning.  She states she has had some associated vaginal bleeding.  She reports her last menstrual period was about 2 weeks ago.  She states the pain is moderate to severe.  She has tried taking oxycodone 10 mg (leftover from prior surgery) without much relief.  She has history of prior ectopic pregnancy on the left.  She is followed by [facility].  History provided by patient.   Review of Systems  Pertinent review of systems noted in HPI.    Physical Exam   Vitals:   06/01/22 2246  BP: 102/66  Pulse: 77  Resp: 16  Temp: 98.2 F (36.8 C)  SpO2: 100%    CONSTITUTIONAL: Uncomfortable-appearing, NAD NEURO:  Alert and oriented x 3, CN 3-12 grossly intact EYES:  eyes equal and reactive ENT/NECK:  Supple, no stridor  CARDIO:  normal rate, regular rhythm, appears well-perfused  PULM:  No respiratory distress,  GI/GU:  non-distended, TTP in the lower abdomen R>L MSK/SPINE:  No gross deformities, no edema, moves all extremities  SKIN:  no rash, atraumatic   *Additional and/or pertinent findings included in MDM below  Diagnostic and Interventional Summary    EKG Interpretation  Date/Time:    Ventricular Rate:    PR Interval:    QRS Duration:   QT Interval:    QTC Calculation:   R Axis:     Text Interpretation:         Labs Reviewed  COMPREHENSIVE METABOLIC PANEL - Abnormal; Notable for the following components:      Result Value   Glucose, Bld 111 (*)    Creatinine, Ser 1.09 (*)    Total Protein 6.3 (*)    All other components within normal limits  CBC WITH DIFFERENTIAL/PLATELET - Abnormal; Notable for the following components:   WBC  11.8 (*)    Neutro Abs 8.8 (*)    All other components within normal limits  URINALYSIS, ROUTINE W REFLEX MICROSCOPIC - Abnormal; Notable for the following components:   APPearance HAZY (*)    Hgb urine dipstick MODERATE (*)    RBC / HPF >50 (*)    Bacteria, UA RARE (*)    All other components within normal limits  I-STAT BETA HCG BLOOD, ED ([facility], [facility], [facility] ONLY) - Abnormal; Notable for the following components:   I-stat hCG, quantitative 841.3 (*)    All other components within normal limits  LIPASE, BLOOD    [place] Art/Ven Flow Abd Pelv Doppler  Final Result    US OB LESS THAN 14 WEEKS WITH OB TRANSVAGINAL  Final Result      Medications  ibuprofen (ADVIL) tablet 800 mg (800 mg Oral Given 06/02/22 0000)  lactated ringers bolus 1,000 mL (1,000 mLs Intravenous New Bag/Given 06/02/22 0206)  fentaNYL (SUBLIMAZE) injection 50 mcg (50 mcg Intravenous Given 06/02/22 0202)     Procedures  /  Critical Care .Critical Care  Performed by: Roxy Horseman, PA-C Authorized by: Roxy Horseman, PA-C   Critical care provider statement:    Critical care time (minutes):  50   Critical care was necessary to treat or prevent imminent or life-threatening deterioration of the following conditions: ruptured  ectopic pregnancy.   Critical care was time spent personally by me on the following activities:  Development of treatment plan with patient or surrogate, discussions with consultants, evaluation of patient's response to treatment, examination of patient, ordering and review of laboratory studies, ordering and review of radiographic studies, ordering and performing treatments and interventions, pulse oximetry, re-evaluation of patient's condition and review of old charts   ED Course and Medical Decision Making  I have reviewed the triage vital signs, the nursing notes, and pertinent available records from the EMR.  Social Determinants Affecting Complexity of Care: Patient has no clinically significant  social determinants affecting this chief complaint..   ED Course: Clinical Course as of 06/02/22 0220  Fri Jun 02, 2022  0113 I consulted OB/GYN Ma Hillock).  Waiting to hear back. [RB]  0124 Difficulty in reaching on-call for [facility].  I did reach Dr. Ernestina Penna, who will notify the on-call doctor and have him call me back. [RB]    Clinical Course User Index [RB] Roxy Horseman, PA-C    Medical Decision Making Patient here with right lower abdominal pain.  She has history of ovarian cysts and also ectopic pregnancy.  She is fairly tender in her right lower quadrant.  She has been having some vaginal bleeding.  Labs and ultrasound are ordered.  hCG came back positive while patient was in ultrasound.  Received report back from Dr. Bebe Shaggy that he spoke with radiologist, who states that patient has findings concerning for ruptured ectopic pregnancy as well as intrauterine pregnancy with fetal demise.  Amount and/or Complexity of Data Reviewed Labs: ordered.    Details: hCG is a 841 Radiology: ordered.  Risk Prescription drug management. Parenteral controlled substances. Decision regarding hospitalization.     Consultants: I consulted with Dr. Billy Coast from [facility], who will have patient admitted to the obstetrics unit.  I also consulted with Clayton Bibles, CNM, who was instrumental in facilitating that the patient get to the right unit.   Treatment and Plan: Patient's exam and diagnostic results are concerning for ectopic pregnancy.  Feel that patient will need admission to the hospital for further treatment and evaluation.    Final Clinical Impressions(s) / ED Diagnoses     ICD-10-CM   1. Other ectopic pregnancy with intrauterine pregnancy  O00.81       ED Discharge Orders     None         Discharge Instructions Discussed with and Provided to Patient:   Discharge Instructions   None      Roxy Horseman, PA-C 06/02/22 0220     Zadie Rhine, MD 06/02/22 [id number]

## 2022-06-03 ENCOUNTER — Encounter (HOSPITAL_COMMUNITY): Payer: Self-pay | Admitting: Obstetrics

## 2022-06-03 NOTE — Discharge Summary (Signed)
Destiny Lambert MRN: [id number] DOB/AGE: 1991/03/31 31 y.o.  Admit date: 06/01/2022 Discharge date: 06/02/22  Admission Diagnoses: Other ectopic pregnancy with intrauterine pregnancy [O00.81] Ectopic pregnancy [O00.90] Pregnancy with uncertain fetal viability [O36.80X0]  Discharge Diagnoses: Other ectopic pregnancy with intrauterine pregnancy [O00.81] Ectopic pregnancy [O00.90] Pregnancy with uncertain fetal viability [O36.80X0]        Principal Problem:   Ectopic pregnancy Active Problems:   Pregnancy with uncertain fetal viability  Diagnoses at d/c per Darrold Span, MD: hemoperitoneum, right ectopic pregnancy, no IUP  Discharged Condition: good  Hospital Course: Pt admitted with pain and uncertain location of pregnancy. She was admitted for pain control and serial Hgb and beta hcg in the setting of u/s showing hemoperitoneum but no clear ectopic preg and initial u/s read of failed IUP. In the am after admission, pt's pain was stable on IV Dilaudid and while hungry she has immediate emesis after attempting po. Consult was had with radiology (Dr. Billy Coast d/w Dr. Manson Passey and per his recount of the converstation Dr Manson Passey stated definite failed IUP, hematosalpynx, no hematoperitoneum and no evidence for ectopic pregnancy). I assess the pt later that day and while Hgb was stable, beta has slight rise and her abdomen was non-acute, I felt this was blood in the uterus, no evidence for IUP, hemoperitoneum and a right ectopic pregnancy. I d/w pt the options for D&C alone and to follow beta, expectant management with serial beta/ Hgb and D&C with laparoscopy with salpingectomy vs salpingostomy and the associated immediate and long term risks/  benefits. Pt chose D&C with l'scope and salpingectomy if clinical suspicion intraop was ectopic, with full understanding that this would end her fertility. Intraop found no IUP or tissue in the uterus, hemoperitoneum and rupturing right ectopic pregnancy. R salpingecomy was  performed. Pt had appropriate immediate post-op course and requested post-op d/c.   Consults:  radiology  Treatments: surgery: u/s guided D&C, laparascopy with evacuation of hemoperitoneum and right salpingectomy.  Disposition: Discharge disposition: 01-Home or Self Care       Discharge Instructions     Discharge patient   Complete by: As directed    Discharge disposition: 01-Home or Self Care   Discharge patient date: 06/02/2022      Allergies as of 06/02/2022   No Known Allergies      Medication List     STOP taking these medications    prenatal multivitamin Tabs tablet       TAKE these medications    ibuprofen 800 MG tablet Commonly known as: ADVIL Take 1 tablet (800 mg total) by mouth every 8 (eight) hours as needed.   oxyCODONE-acetaminophen 5-325 MG tablet Commonly known as: Percocet Take 2 tablets by mouth every 6 (six) hours as needed for up to 5 days for severe pain.   SUMAtriptan 100 MG tablet Commonly known as: IMITREX Take 100 mg by mouth every 2 (two) hours as needed for migraine. May repeat in 2 hours if headache persists or recurs.        Follow-up Information     Noland Fordyce, MD. Schedule an appointment as soon as possible for a visit.   Specialty: Obstetrics and Gynecology Why: As needed, If symptoms worsen Contact information: [address] [place] [address] [phone]                 Signed: Lendon Colonel, MD 06/03/2022, 8:37 PM

## 2022-06-06 LAB — SURGICAL PATHOLOGY

## 2024-07-02 ENCOUNTER — Ambulatory Visit: Attending: Cardiovascular Disease | Admitting: Cardiovascular Disease

## 2024-07-02 ENCOUNTER — Encounter: Payer: Self-pay | Admitting: Cardiovascular Disease

## 2024-07-02 VITALS — BP 110/80 | HR 79 | Ht 66.0 in | Wt 141.6 lb

## 2024-07-02 DIAGNOSIS — R079 Chest pain, unspecified: Secondary | ICD-10-CM

## 2024-07-02 DIAGNOSIS — Z823 Family history of stroke: Secondary | ICD-10-CM | POA: Diagnosis not present

## 2024-07-02 NOTE — Patient Instructions (Addendum)
 Medication Instructions:  Your physician recommends that you continue on your current medications as directed. Please refer to the Current Medication list given to you today.  *If you need a refill on your cardiac medications before your next appointment, please call your pharmacy*  Lab Work: None ordered.  You may go to any [facility] Location for your lab work:  [facility] - [address] ([facility]) - [address] [phone] [address]  [place] - [address]   [place]  - [address]   [place] - [address] - [address] [employer]  - [address] [place] - [address] ([facility]   If you have labs (blood work) drawn today and your tests are completely normal, you will receive your results only by: [employer] (if you have MyChart)  If you have any lab test that is abnormal or we need to change your treatment, we will call you or send a MyChart message to review the results.  Testing/Procedures: None ordered.  Follow-Up: At [facility], you and your health needs are our priority.  As part of our continuing mission to provide you with exceptional heart care, we have created designated Provider Care Teams.  These Care Teams include your primary Cardiologist (physician) and Advanced Practice Providers (APPs -  Physician Assistants and Nurse Practitioners) who all work together to provide you with the care you need, when you need it.  Your next appointment:   As needed  The format for your next appointment:   In Person  Provider:   Ozell Fell, MD

## 2024-07-02 NOTE — Progress Notes (Signed)
 Cardiology Office Note:    Date:  07/02/2024   ID:  Destiny Lambert, DOB 06-Jun-1991, MRN [id number]  PCP:  Debrah Josette MOHR PA-C   [facility] Providers Cardiologist:  None     Referring MD: Debrah Josette ORN., PA-C   Chief Complaint  Patient presents with   Chest Pain    History of Present Illness:    Destiny Lambert is a 33 y.o. female presenting for cardiology evaluation.  She has had concerns about her stroke risk.  Her aunt had a PFO-related stroke and also had a long history of migraine headaches.  The patient has had increasing migraine headaches recently and has been concerned about whether or not she has a PFO present.  She also has occasional sharp chest pains.  These occur at rest and are fleeting in nature.  They are located over the left central portion of the chest.  The patient is physically active and exercises regularly with no exertional symptoms.  She specifically denies exertional chest pain or pressure.  She has started having migraines with aura.  She has vision changes when she experiences her headaches.  Past Medical History:  Diagnosis Date   Anemia    Headache    Past Surgical History:  Procedure Laterality Date   DIAGNOSTIC LAPAROSCOPY WITH REMOVAL OF ECTOPIC PREGNANCY N/A 09/29/2021   Procedure: DIAGNOSTIC LAPAROSCOPY WITH REMOVAL OF ECTOPIC PREGNANCY;  Surgeon: Rendell Calton LABOR, DO;  Location: [facility] OR;  Service: Gynecology;  Laterality: N/A;   DILATION AND EVACUATION N/A 06/02/2022   Procedure: ULTRASOUND GUIDED DILATATION AND CURETTINGS;  Surgeon: Kandyce Sor, MD;  Location[facility];  Service: Gynecology;  Laterality: N/A;   LAPAROSCOPIC UNILATERAL SALPINGECTOMY Left 09/29/2021   Procedure: LAPAROSCOPIC UNILATERAL SALPINGECTOMY;  Surgeon: Rendell Calton LABOR, DO;  Location: [facility] OR;  Service: Gynecology;  Laterality: Left;   LAPAROSCOPIC UNILATERAL SALPINGECTOMY Right 06/02/2022   Procedure: LAPAROSCOPIC RIGHT SALPINGECTOMY WITH REMOVAL OF ECTOPIC  PREGNANCY, REMOVAL OF HEMOPERITONEUM;  Surgeon: Kandyce Sor, MD;  Location: [facility] OR;  Service: Gynecology;  Laterality: Right;   MOUTH SURGERY     TOOTH EXTRACTION      Current Medications: Current Meds  Medication Sig   albuterol (VENTOLIN HFA) 108 (90 Base) MCG/ACT inhaler Inhale 2 puffs into the lungs as needed.   bimatoprost (LATISSE) 0.03 % ophthalmic solution Place into both eyes daily.   spironolactone (ALDACTONE) 100 MG tablet Take 150 mg by mouth daily.   SUMAtriptan (IMITREX) 100 MG tablet Take 100 mg by mouth every 2 (two) hours as needed for migraine. May repeat in 2 hours if headache persists or recurs.   tretinoin (RETIN-A) 0.05 % cream Apply topically daily.     Allergies:   Patient has no allergy information on record.   ROS:   Please see the history of present illness.    All other systems reviewed and are negative.  EKGs/Labs/Other Studies Reviewed:    The following studies were reviewed today:     EKG:   EKG Interpretation Date/Time:  Wednesday July 02 2024 14:49:56 EDT Ventricular Rate:  79 PR Interval:  106 QRS Duration:  70 QT Interval:  360 QTC Calculation: 412 R Axis:   80  Text Interpretation: Sinus rhythm with short PR No previous ECGs available Confirmed by Wonda Sharper [id number]) on 07/02/2024 2:58:18 PM    Recent Labs: No results found for requested labs within last 365 days.  Recent Lipid Panel No results found for: CHOL, TRIG, HDL, CHOLHDL, VLDL, LDLCALC, LDLDIRECT  Risk Assessment/Calculations:                Physical Exam:    VS:  BP 110/80   Pulse 79   Ht 5' 6 (1.676 m)   Wt 141 lb 9.6 oz (64.2 kg)   SpO2 98%   BMI 22.85 kg/m     Wt Readings from Last 3 Encounters:  07/02/24 141 lb 9.6 oz (64.2 kg)  09/29/21 139 lb (63 kg)  12/15/17 135 lb (61.2 kg)     GEN:  Well nourished, well developed in no acute distress HEENT: Normal NECK: No JVD; No carotid bruits LYMPHATICS: No  lymphadenopathy CARDIAC: RRR, no murmurs, rubs, gallops RESPIRATORY:  Clear to auscultation without rales, wheezing or rhonchi  ABDOMEN: Soft, non-tender, non-distended MUSCULOSKELETAL:  No edema; No deformity  SKIN: Warm and dry NEUROLOGIC:  Alert and oriented x 3 PSYCHIATRIC:  Normal affect   Assessment & Plan Family history of stroke Reviewed patient's personal risk factors.  She is healthy, normotensive, with no evidence of hyperlipidemia.  Labs are reviewed through care everywhere and her cholesterol is 148, triglycerides 39, HDL 46, and LDL 91.  We discussed the association of PFO and cryptogenic stroke as well as the association of PFO and migraine headache.  She understands that PFO closure is not approved for treatment of migraine headache due to lack of clinical efficacy.  I discussed that we could screen for PFO with an echo bubble study, but she really would not be a candidate for transcatheter closure and I do not think it would impact her medical therapy or treatment moving forward.  We decided to hold off on any cardiac imaging at this time.  We talked about the primary preventative measures with respect to stroke and these would include healthy diet, exercise, avoiding tobacco, and monitoring cholesterol and blood pressure, all of which are currently well-managed with this patient. Chest pain of uncertain etiology Atypical symptoms, very low suspicion for cardiac etiology.  Patient reassured.  I would be happy to see her back in the future if any problems arise.            Medication Adjustments/Labs and Tests Ordered: Current medicines are reviewed at length with the patient today.  Concerns regarding medicines are outlined above.  Orders Placed This Encounter  Procedures   EKG 12-Lead   No orders of the defined types were placed in this encounter.   Patient Instructions  Medication Instructions:  Your physician recommends that you continue on your current medications  as directed. Please refer to the Current Medication list given to you today.  *If you need a refill on your cardiac medications before your next appointment, please call your pharmacy*  Lab Work: None ordered.  You may go to any [facility] Location for your lab work:  [facility] - [address] ([facility]) - [phone]  [place] - [address]   [place]  - [address]   [place] - [address] - [address] [employer]  - [address] [place] - [address] ([facility]   If you have labs (blood work) drawn today and your tests are completely normal, you will receive your results only by: MyChart Message (if you have MyChart)  If you have any lab test that is abnormal or we need to change your treatment,  we will call you or send a MyChart message to review the results.  Testing/Procedures: None ordered.  Follow-Up: At [facility], you and your health needs are our priority.  As part of our continuing mission to provide you with exceptional heart care, we have created designated Provider Care Teams.  These Care Teams include your primary Cardiologist (physician) and Advanced Practice Providers (APPs -  Physician Assistants and Nurse Practitioners) who all work together to provide you with the care you need, when you need it.  Your next appointment:   As needed  The format for your next appointment:   In Person  Provider:   Ozell Fell, MD         Signed, Ozell Fell, MD  07/02/2024 3:58 PM    [facility]
